# Patient Record
Sex: Female | Born: 1950
Health system: Southern US, Community
[De-identification: ages and names within clinical notes are randomized; demographics above are authoritative.]

## PROBLEM LIST (undated history)

## (undated) DIAGNOSIS — R9431 Abnormal electrocardiogram [ECG] [EKG]: Secondary | ICD-10-CM

## (undated) DIAGNOSIS — R7989 Other specified abnormal findings of blood chemistry: Secondary | ICD-10-CM

## (undated) DIAGNOSIS — R0789 Other chest pain: Secondary | ICD-10-CM

## (undated) DIAGNOSIS — R1110 Vomiting, unspecified: Secondary | ICD-10-CM

## (undated) DIAGNOSIS — M549 Dorsalgia, unspecified: Secondary | ICD-10-CM

## (undated) DIAGNOSIS — R51 Headache: Secondary | ICD-10-CM

## (undated) DIAGNOSIS — Z9889 Other specified postprocedural states: Secondary | ICD-10-CM

## (undated) DIAGNOSIS — R110 Nausea: Secondary | ICD-10-CM

## (undated) DIAGNOSIS — R519 Headache, unspecified: Secondary | ICD-10-CM

## (undated) DIAGNOSIS — C50919 Malignant neoplasm of unspecified site of unspecified female breast: Secondary | ICD-10-CM

## (undated) DIAGNOSIS — R778 Other specified abnormalities of plasma proteins: Secondary | ICD-10-CM

## (undated) HISTORY — DX: Nausea: R11.0

## (undated) HISTORY — DX: Other specified postprocedural states: Z98.890

## (undated) HISTORY — DX: Headache: R51

## (undated) HISTORY — DX: Other specified abnormalities of plasma proteins: R77.8

## (undated) HISTORY — DX: Headache, unspecified: R51.9

## (undated) HISTORY — DX: Vomiting, unspecified: R11.10

## (undated) HISTORY — DX: Other specified abnormal findings of blood chemistry: R79.89

## (undated) HISTORY — PX: BACK SURGERY: SHX140

---
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
## 2010-10-02 HISTORY — PX: CRANIOTOMY: SHX93

## 2012-06-08 ENCOUNTER — Emergency Department (HOSPITAL_COMMUNITY): Payer: Medicare Other

## 2012-06-08 ENCOUNTER — Inpatient Hospital Stay (HOSPITAL_COMMUNITY)
Admission: EM | Admit: 2012-06-08 | Discharge: 2012-06-10 | DRG: 313 | Disposition: A | Discharge status: Home / Self Care | Payer: Medicare Other | Attending: Cardiovascular Disease | Admitting: Cardiovascular Disease

## 2012-06-08 ENCOUNTER — Encounter (HOSPITAL_COMMUNITY): Payer: Self-pay | Admitting: Emergency Medicine

## 2012-06-08 DIAGNOSIS — Z79899 Other long term (current) drug therapy: Secondary | ICD-10-CM

## 2012-06-08 DIAGNOSIS — R748 Abnormal levels of other serum enzymes: Secondary | ICD-10-CM | POA: Diagnosis present

## 2012-06-08 DIAGNOSIS — R930 Abnormal findings on diagnostic imaging of skull and head, not elsewhere classified: Secondary | ICD-10-CM | POA: Diagnosis present

## 2012-06-08 DIAGNOSIS — D72829 Elevated white blood cell count, unspecified: Secondary | ICD-10-CM | POA: Diagnosis present

## 2012-06-08 DIAGNOSIS — R7989 Other specified abnormal findings of blood chemistry: Secondary | ICD-10-CM | POA: Diagnosis present

## 2012-06-08 DIAGNOSIS — R0989 Other specified symptoms and signs involving the circulatory and respiratory systems: Secondary | ICD-10-CM | POA: Diagnosis present

## 2012-06-08 DIAGNOSIS — R1013 Epigastric pain: Secondary | ICD-10-CM | POA: Diagnosis present

## 2012-06-08 DIAGNOSIS — R0789 Other chest pain: Principal | ICD-10-CM | POA: Diagnosis present

## 2012-06-08 DIAGNOSIS — I214 Non-ST elevation (NSTEMI) myocardial infarction: Secondary | ICD-10-CM

## 2012-06-08 DIAGNOSIS — R9431 Abnormal electrocardiogram [ECG] [EKG]: Secondary | ICD-10-CM | POA: Diagnosis present

## 2012-06-08 DIAGNOSIS — R112 Nausea with vomiting, unspecified: Secondary | ICD-10-CM | POA: Diagnosis present

## 2012-06-08 HISTORY — DX: Abnormal electrocardiogram (ECG) (EKG): R94.31

## 2012-06-08 HISTORY — DX: Other chest pain: R07.89

## 2012-06-08 LAB — CBC WITH DIFFERENTIAL/PLATELET
Basophils Relative: 0 % (ref 0–1)
Hemoglobin: 14.8 g/dL (ref 12.0–15.0)
Lymphs Abs: 1.8 10*3/uL (ref 0.7–4.0)
Monocytes Relative: 7 % (ref 3–12)
Neutro Abs: 15.7 10*3/uL — ABNORMAL HIGH (ref 1.7–7.7)
Neutrophils Relative %: 83 % — ABNORMAL HIGH (ref 43–77)
RBC: 5.14 MIL/uL — ABNORMAL HIGH (ref 3.87–5.11)

## 2012-06-08 LAB — BASIC METABOLIC PANEL
BUN: 16 mg/dL (ref 6–23)
Chloride: 104 mEq/L (ref 96–112)
GFR calc Af Amer: >90 mL/min (ref >90)
Glucose, Bld: 112 mg/dL — ABNORMAL HIGH (ref 70–99)
Potassium: 3.3 mEq/L — ABNORMAL LOW (ref 3.5–5.1)
Sodium: 141 mEq/L (ref 135–145)

## 2012-06-08 LAB — POCT I-STAT TROPONIN I
Troponin i, poc: 0.02 ng/mL (ref 0.00–0.08)
Troponin i, poc: 0.11 ng/mL (ref 0.00–0.08)

## 2012-06-08 LAB — HEPATIC FUNCTION PANEL
ALT: 26 U/L (ref 0–35)
Total Protein: 6.9 g/dL (ref 6.0–8.3)

## 2012-06-08 MED ORDER — HEPARIN BOLUS VIA INFUSION
4000.0000 [IU] | Freq: Once | INTRAVENOUS | Status: DC
  Administered 2012-06-08: 4000 [IU] via INTRAVENOUS

## 2012-06-08 MED ORDER — SODIUM CHLORIDE 0.9 % IV BOLUS (SEPSIS)
500.0000 mL | Freq: Once | INTRAVENOUS | Status: AC
  Administered 2012-06-08: 16:00:00 via INTRAVENOUS

## 2012-06-08 MED ORDER — HEPARIN (PORCINE) IN NACL 100-0.45 UNIT/ML-% IJ SOLN
900.0000 [IU]/h | INTRAMUSCULAR | Status: DC
  Administered 2012-06-08: 900 [IU]/h via INTRAVENOUS
  Filled 2012-06-08: qty 250

## 2012-06-08 MED ORDER — ATORVASTATIN CALCIUM 10 MG PO TABS
10.0000 mg | ORAL_TABLET | Freq: Every day | ORAL | Status: DC
  Administered 2012-06-09: 10 mg via ORAL
  Filled 2012-06-08 (×2): qty 1

## 2012-06-08 MED ORDER — NITROGLYCERIN 0.4 MG SL SUBL: 0.4000 mg | SUBLINGUAL_TABLET | SUBLINGUAL | Status: DC | PRN

## 2012-06-08 MED ORDER — NITROGLYCERIN 0.4 MG SL SUBL
0.4000 mg | SUBLINGUAL_TABLET | SUBLINGUAL | Status: DC | PRN
  Administered 2012-06-08: 0.4 mg via SUBLINGUAL
  Filled 2012-06-08: qty 25

## 2012-06-08 MED ORDER — ASPIRIN 81 MG PO CHEW
324.0000 mg | CHEWABLE_TABLET | ORAL | Status: AC
  Administered 2012-06-08: 324 mg via ORAL

## 2012-06-08 MED ORDER — ASPIRIN EC 81 MG PO TBEC
81.0000 mg | DELAYED_RELEASE_TABLET | Freq: Every day | ORAL | Status: DC
  Administered 2012-06-09: 81 mg via ORAL
  Filled 2012-06-08 (×2): qty 1

## 2012-06-08 MED ORDER — ONDANSETRON HCL 4 MG/2ML IJ SOLN: 4.0000 mg | Freq: Four times a day (QID) | INTRAMUSCULAR | Status: DC | PRN

## 2012-06-08 MED ORDER — ASPIRIN 81 MG PO CHEW
324.0000 mg | CHEWABLE_TABLET | Freq: Once | ORAL | Status: AC
  Administered 2012-06-08: 324 mg via ORAL
  Filled 2012-06-08: qty 4

## 2012-06-08 MED ORDER — ASPIRIN 300 MG RE SUPP: 300.0000 mg | RECTAL | Status: AC

## 2012-06-08 MED ORDER — ACETAMINOPHEN 325 MG PO TABS: 650.0000 mg | ORAL_TABLET | ORAL | Status: DC | PRN

## 2012-06-08 NOTE — Progress Notes (Signed)
ANTICOAGULATION CONSULT NOTE - Initial Consult  Pharmacy Consult for Heparin Indication: NSTEMI  No Known Allergies  Patient Measurements: Height: 5\' 2"  (157.5 cm) Weight: 162 lb (73.483 kg) IBW/kg (Calculated) : 50.1  Heparin Dosing Weight: 66 Kg  Vital Signs: Temp: 98.5 F (36.9 C) (01/06 1212) Temp src: Oral (01/06 1212) BP: 122/72 mmHg (01/06 2015) Pulse Rate: 63  (01/06 2015)  Labs:  Basename 06/08/12 1300  HGB 14.8  HCT 43.3  PLT 240  APTT --  LABPROT --  INR --  HEPARINUNFRC --  CREATININE 0.60  CKTOTAL --  CKMB --  TROPONINI --    Estimated Creatinine Clearance: 69.4 ml/min (by C-G formula based on Cr of 0.6).   Medical History: History reviewed. No pertinent past medical history.  Medications:  Mucinex PTA  Assessment: Shannon Fernandez is a 62 yo without significant PMH who presents with CP and rising troponin. No prior anticoagulants noted, baseline H/H and plts are normal, LFTs and Scr is normal.   Goal of Therapy:  Heparin level 0.3-0.7 units/ml Monitor platelets by anticoagulation protocol: Yes   Plan:  Give 4000 units bolus x 1 Start heparin infusion at 900 units/hr Check anti-Xa level in 6 hours and daily while on heparin Continue to monitor H&H and platelets   Thanks, Shannon Fernandez, PharmD, BCPS.  Clinical Pharmacist Pager [phone]. 06/08/2012 8:31 PM

## 2012-06-08 NOTE — H&P (Signed)
[facility] History and Physical  Derrian Rodak ZOX:[id number] DOB: 09-May-1951 DOA: 06/08/2012  Referring physician: ED PCP: Dennis Bast, MD  Specialists: None  Chief Complaint: Chest pain, emesis  HPI: Shannon Fernandez is a 62 y.o. female who presents with a pressure like substernal chest pain, onset earlier today at 11 am, was 6/10, after onset was associated with multiple non bloody non bilious emesis episodes this AM.  Patient denies recent DOE, or SOB.  CP not really associated with SOB she states.  In the ED patient was found to have a positive troponin of 0.11, given her symptoms and the positive troponin hospitalist has been asked to admit for [facility] per prior arrangement for patients NSTEMI.  Review of Systems: 12 systems reviewed and otherwise negative.  History reviewed. No pertinent past medical history. History reviewed. No pertinent past surgical history. Social History:  reports that she has never smoked. She does not have any smokeless tobacco history on file. She reports that she does not drink alcohol or use illicit drugs.  No Known Allergies  History reviewed. No pertinent family history. No history of early onset heart disease.  Prior to Admission medications   Medication Sig Start Date End Date Taking? Authorizing Provider  guaiFENesin (MUCINEX) 600 MG 12 hr tablet Take 600 mg by mouth at bedtime.   Yes Historical Provider, MD   Physical Exam: Filed Vitals:   06/08/12 1945 06/08/12 2012 06/08/12 2015 06/08/12 2045  BP: 116/54  122/72 119/57  Pulse: 58  63 63  Temp:      TempSrc:      Resp: 18  16 18   Height:  5\' 2"  (1.575 m)    Weight:  73.483 kg (162 lb)    SpO2: 98%  99% 98%    General:  NAD, resting comfortably in bed Eyes: PEERLA EOMI ENT: mucous membranes moist Neck: supple w/o JVD Cardiovascular: RRR w/o MRG Respiratory: CTA B Abdomen: soft, nt, nd, bs+ Skin: no rash nor lesion Musculoskeletal: MAE, full ROM all 4 extremities Psychiatric:  normal tone and affect Neurologic: AAOx3, grossly non-focal  Labs on Admission:  Basic Metabolic Panel:  Lab 06/08/12 8119  NA 141  K 3.3*  CL 104  CO2 23  GLUCOSE 112*  BUN 16  CREATININE 0.60  CALCIUM 9.8  MG --  PHOS --   Liver Function Tests:  Lab 06/08/12 1630  AST 23  ALT 26  ALKPHOS 61  BILITOT 0.4  PROT 6.9  ALBUMIN 3.6    Lab 06/08/12 1300  LIPASE 44  AMYLASE --   No results found for this basename: AMMONIA:5 in the last 168 hours CBC:  Lab 06/08/12 1300  WBC 18.9*  NEUTROABS 15.7*  HGB 14.8  HCT 43.3  MCV 84.2  PLT 240   Cardiac Enzymes: No results found for this basename: CKTOTAL:5,CKMB:5,CKMBINDEX:5,TROPONINI:5 in the last 168 hours  BNP (last 3 results) No results found for this basename: PROBNP:3 in the last 8760 hours CBG: No results found for this basename: GLUCAP:5 in the last 168 hours  Radiological Exams on Admission: Dg Chest 2 View  06/08/2012  *RADIOLOGY REPORT*  Clinical Data: Chest pain.  CHEST - 2 VIEW  Comparison: None  Findings: Heart and mediastinal contours are within normal limits. No focal opacities or effusions.  No acute bony abnormality.  IMPRESSION: No active cardiopulmonary disease.   Original Report Authenticated By: Charlett Nose, M.D.    Ct Head Wo Contrast  06/08/2012  *RADIOLOGY REPORT*  Clinical Data: Sudden onset vomiting.  Buzzing sounds in the ears. Headache.  CT HEAD WITHOUT CONTRAST  Technique:  Contiguous axial images were obtained from the base of the skull through the vertex without contrast.  Comparison: None.  Findings: Left occipital craniotomy.  There is a small calcified mass at the left cerebellopontine angle measuring 16 mm AP by 11 mm transverse.  This may represent a calcified meningioma. Potentially this could be postsurgical as well.  Correlate with surgical history.  Fluid is present within the left posterior mastoid air cells.  Paranasal sinuses appear clear.  There is no midline shift, hydrocephalus,  or evidence of acute infarction. Benign basal ganglia calcifications are present.  IMPRESSION: 1.  No acute intracranial abnormality. 2.  Left occipital craniotomy. 3.  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  This may represent postsurgical changes or a small calcified meningioma.  Correlate with previous neurosurgical history.   Original Report Authenticated By: Andreas Newport, M.D.    US Abdomen Complete  06/08/2012  *RADIOLOGY REPORT*  Clinical Data:  Chest pain.  Abdominal pain.  COMPLETE ABDOMINAL ULTRASOUND  Comparison:  None.  Findings:  Gallbladder:  No gallstones, gallbladder wall thickening, or pericholecystic fluid.  Common bile duct:  4 mm, normal.  Liver:  No focal mass lesion.  Diffusely echogenic suggesting hepatic steatosis.  IVC:  Appears normal.  Pancreas:  No focal abnormality seen.  Spleen:  8.6 cm.  Normal echotexture.  Right Kidney:  13.4 cm. Normal echotexture.  Normal central sinus echo complex.  No calculi or hydronephrosis.  Left Kidney:  12.6 cm. Normal echotexture.  Normal central sinus echo complex.  No calculi or hydronephrosis.  Abdominal aorta:  No aneurysm identified.  IMPRESSION: Negative for cholelithiasis or cholecystitis.  Echogenic liver most compatible with hepatic steatosis.   Original Report Authenticated By: Andreas Newport, M.D.     EKG: Independently reviewed.  Assessment/Plan Principal Problem:  *NSTEMI (non-ST elevated myocardial infarction)   1. NSTEMI - given the presence of elevated troponin (however mildly elevated) in the context of the patients symptoms (substernal chest pain, sudden onset in AM, associated with N/V), and absence of other obvious causes, will treat initially as NSTEMI / ACS.  Discussed with [facility] and they advised heparin gtt, at this time.  Have ordered this.  Patient is hemodynamically stable otherwise, her CP has resolved at this time so will hold off  on starting a nitro gtt for now but would start this if chest pain returned.  Admitting patient to stepdown, serial troponins ordered to see if these do indeed trend up further.  Will keep patient NPO after midnight in case cardiology wants to take her to the cath lab tomorrow.  [facility] has been consulted for NSTEMI, likely will assume care in AM per prior arrangement.  Code Status: Full Code (must indicate code status--if unknown or must be presumed, indicate so) Family Communication: Spoke with daughter at bedside (indicate person spoken with, if applicable, with phone number if by telephone) Disposition Plan: Admit to inpatient (indicate anticipated LOS)  Time spent: 70 min  GARDNER, JARED M. [facility] Pager [phone]  If 7PM-7AM, please contact night-coverage [email or web address] Password [facility] 06/08/2012, 10:57 PM

## 2012-06-08 NOTE — ED Notes (Signed)
Pt denies any aspirin intake in past 12hrs

## 2012-06-08 NOTE — ED Notes (Signed)
Pt c/o midsternal CP with vomiting this am; pt sts buzzing sound in head with blurry vision last night that is now resolved; pt sts hx of cerebral artery blockage

## 2012-06-08 NOTE — ED Provider Notes (Signed)
History     CSN: [id number]  Arrival date & time 06/08/12  1156   First MD Initiated Contact with Patient 06/08/12 1507      Chief Complaint  Patient presents with  . Chest Pain  . Emesis  . Headache    (Consider location/radiation/quality/duration/timing/severity/associated sxs/prior treatment) HPI  Shannon Fernandez is a 62 y.o. female complaining of pressure like substernal chest pain starting at 11 AM. Pain is rated a 6/10 this started after that the patient had multiple episodes of nonbloody, non-coffee ground slightly green emesis this a.m. vomiting started after patient had a spinach and a half take this a.m. Patient had a sensation of hearing pulsations in her right ear starting at 9 PM last night. This is the second time this has happened. The first time was after compression or several years ago for which she had had surgery to correct. Patient denies shortness of breath, headache, dysarthria, difficulty understanding words, difficulty walking, change in bowel or bladder habits. One prior abdominal surgery: Appendectomy.  PCP yuri cabesa.   History reviewed. No pertinent past medical history.  History reviewed. No pertinent past surgical history.  History reviewed. No pertinent family history.  History  Substance Use Topics  . Smoking status: Never Smoker   . Smokeless tobacco: Not on file  . Alcohol Use: No    OB History    Grav Para Term Preterm Abortions TAB SAB Ect Mult Living                  Review of Systems  Constitutional: Negative for fever.  Respiratory: Negative for shortness of breath.   Cardiovascular: Negative for chest pain.  Gastrointestinal: Positive for nausea and vomiting. Negative for abdominal pain and diarrhea.  All other systems reviewed and are negative.    Allergies  Review of patient's allergies indicates no known allergies.  Home Medications  No current outpatient prescriptions on file.  BP 132/73  Pulse 114  Temp 98.5 F  (36.9 C) (Oral)  Resp 20  SpO2 93%  Physical Exam  Nursing note and vitals reviewed. Constitutional: She is oriented to person, place, and time. She appears well-developed and well-nourished. No distress.  HENT:  Head: Normocephalic and atraumatic.  Right Ear: External ear normal.  Left Ear: External ear normal.  Mouth/Throat: Oropharynx is clear and moist.  Eyes: Conjunctivae normal and EOM are normal. Pupils are equal, round, and reactive to light.  Neck: Normal range of motion. No JVD present.  Cardiovascular: Normal rate and intact distal pulses.   Pulmonary/Chest: Effort normal and breath sounds normal. No stridor. No respiratory distress. She has no wheezes. She has no rales. She exhibits tenderness.       Patient is tender but not reproducibly so to the lower sternum  Abdominal: Soft. Bowel sounds are normal. She exhibits no distension and no mass. There is tenderness. There is no rebound and no guarding.       Tenderness to palpation of the epigastrium and right lower quadrant with no guarding or rebound.  Musculoskeletal: Normal range of motion.  Neurological: She is alert and oriented to person, place, and time.  Psychiatric: She has a normal mood and affect.    ED Course  Procedures (including critical care time)  Labs Reviewed  CBC WITH DIFFERENTIAL - Abnormal; Notable for the following:    WBC 18.9 (*)     RBC 5.14 (*)     Neutrophils Relative 83 (*)     Neutro Abs 15.7 (*)  Lymphocytes Relative 10 (*)     Monocytes Absolute 1.2 (*)     All other components within normal limits  BASIC METABOLIC PANEL - Abnormal; Notable for the following:    Potassium 3.3 (*)     Glucose, Bld 112 (*)     All other components within normal limits  LIPASE, BLOOD  POCT I-STAT TROPONIN I   Dg Chest 2 View  06/08/2012  *RADIOLOGY REPORT*  Clinical Data: Chest pain.  CHEST - 2 VIEW  Comparison: None  Findings: Heart and mediastinal contours are within normal limits. No focal  opacities or effusions.  No acute bony abnormality.  IMPRESSION: No active cardiopulmonary disease.   Original Report Authenticated By: Charlett Nose, M.D.     Date: 06/08/2012  Rate: 115  Rhythm: sinus tachycardia  QRS Axis: right  Intervals: normal  ST/T Wave abnormalities: nonspecific ST changes and nonspecific ST/T changes  Conduction Disutrbances:none  Narrative Interpretation:   Old EKG Reviewed: none available   Date: 06/08/2012  Rate: 59  Rhythm: normal sinus rhythm  QRS Axis: left  Intervals: normal  ST/T Wave abnormalities: nonspecific T wave changes  Conduction Disutrbances:none  Narrative Interpretation: T Flipped in Avl  Old EKG Reviewed: unchanged    3:51 PM patient reports a slight resolution chest pressure after one nitroglycerin pain reduced from 6/10 at 5/10.  1. NSTEMI (non-ST elevated myocardial infarction)       MDM  Delta troponin is positive at 0.11  Pt  Will be admitted to to [facility] Dr. Julian Reil.         Wynetta Emery, PA-C 06/09/12 0020

## 2012-06-09 ENCOUNTER — Encounter (HOSPITAL_COMMUNITY): Payer: Self-pay | Admitting: Cardiology

## 2012-06-09 DIAGNOSIS — R0989 Other specified symptoms and signs involving the circulatory and respiratory systems: Secondary | ICD-10-CM | POA: Diagnosis present

## 2012-06-09 DIAGNOSIS — D72829 Elevated white blood cell count, unspecified: Secondary | ICD-10-CM | POA: Diagnosis present

## 2012-06-09 DIAGNOSIS — R1013 Epigastric pain: Secondary | ICD-10-CM | POA: Diagnosis present

## 2012-06-09 LAB — CBC
HCT: 36.4 % (ref 36.0–46.0)
MCH: 28.2 pg (ref 26.0–34.0)
MCHC: 33.5 g/dL (ref 30.0–36.0)
MCV: 84.3 fL (ref 78.0–100.0)
Platelets: 198 10*3/uL (ref 150–400)
RDW: 13.5 % (ref 11.5–15.5)

## 2012-06-09 LAB — LIPID PANEL
LDL Cholesterol: 112 mg/dL — ABNORMAL HIGH (ref 0–99)
Total CHOL/HDL Ratio: 5.3 RATIO
Triglycerides: 176 mg/dL — ABNORMAL HIGH (ref <150)
VLDL: 35 mg/dL (ref 0–40)

## 2012-06-09 LAB — BASIC METABOLIC PANEL
BUN: 12 mg/dL (ref 6–23)
CO2: 27 mEq/L (ref 19–32)
Calcium: 8.8 mg/dL (ref 8.4–10.5)
GFR calc non Af Amer: >90 mL/min (ref >90)
Glucose, Bld: 89 mg/dL (ref 70–99)

## 2012-06-09 LAB — TROPONIN I
Troponin I: <0.3 ng/mL (ref <0.30)
Troponin I: <0.3 ng/mL (ref <0.30)

## 2012-06-09 MED ORDER — HEPARIN (PORCINE) IN NACL 100-0.45 UNIT/ML-% IJ SOLN
1000.0000 [IU]/h | INTRAMUSCULAR | Status: DC
  Filled 2012-06-09 (×2): qty 250

## 2012-06-09 MED ORDER — PANTOPRAZOLE SODIUM 40 MG PO TBEC
40.0000 mg | DELAYED_RELEASE_TABLET | Freq: Every day | ORAL | Status: DC
  Administered 2012-06-09: 40 mg via ORAL
  Filled 2012-06-09: qty 1

## 2012-06-09 NOTE — Consult Note (Signed)
Reason for Consult: Elevated POC marker  Requesting Physician: [facility]  HPI: This is a 62 y.o. female originally from [place], who has lived in [place] for the last 21 yrs. She and her husband  recently moved to [place]. She has a history of a craniotomy in [place] May 2012 but no other significant medical history or surgeries. Yesterday afternoon she had some vague headache and "heard a noise" in her head. She drank a protein shake and the had nausea and vomiting. This was followed by epigastric pain. In the ER her POC Troponin was positive- 0.11, (NL 0.0-0.08). EKG shows no acute changes. She was placed on Heparin and admitted. Second Troponin is WNL. Her epigastric pain resolved spontaneously. In the ER her initial WBC was 19K with a left shift- this am its 6K (? Lab error)  PMHx:  History reviewed. No pertinent past medical history. Past Surgical History  Procedure Date  . Craniotomy 5/12    in [facility]    FAMHx: History reviewed. No pertinent family history.  SOCHx:  reports that she has never smoked. She does not have any smokeless tobacco history on file. She reports that she does not drink alcohol or use illicit drugs.  ALLERGIES: No Known Allergies  ROS: Pertinent items are noted in HPI. Her son in law says she has been under a great deal of stress secondary to some family issues. 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 No fever or chills, no diarhea. She had a flu shot last week, she has had a cough, no orthopnea or DOE  HOME MEDICATIONS: Prescriptions prior to admission  Medication Sig Dispense Refill  . guaiFENesin (MUCINEX) 600 MG 12 hr tablet Take 600 mg by mouth at bedtime.        HOSPITAL MEDICATIONS: I have reviewed the patient's current medications.  VITALS: Blood pressure 127/54, pulse 63, temperature 97.8 F (36.6 C), temperature source Oral, resp. rate 18, height 5\' 2"  (1.575 m), weight 73.483 kg (162 lb), SpO2 99.00%.  PHYSICAL  EXAM: General appearance: alert, cooperative and no distress Neck: no carotid bruit and no JVD Lungs: clear to auscultation bilaterally Heart: regular rate and rhythm, S1, S2 normal, no murmur, click, rub or gallop Abdomen: soft, non-tender; bowel sounds normal; no masses,  no organomegaly Extremities: extremities normal, atraumatic, no cyanosis or edema Pulses: 2+ and symmetric Skin: Skin color, texture, turgor normal. No rashes or lesions Neurologic: Grossly normal  LABS: Results for orders placed during the hospital encounter of 06/08/12 (from the past 48 hour(s))  CBC WITH DIFFERENTIAL     Status: Abnormal   Collection Time   06/08/12  1:00 PM      Component Value Range Comment   WBC 18.9 (*) 4.0 - 10.5 K/uL    RBC 5.14 (*) 3.87 - 5.11 MIL/uL    Hemoglobin 14.8  12.0 - 15.0 g/dL    HCT 16.1  09.6 - 04.5 %    MCV 84.2  78.0 - 100.0 fL    MCH 28.8  26.0 - 34.0 pg    MCHC 34.2  30.0 - 36.0 g/dL    RDW 40.9  81.1 - 91.4 %    Platelets 240  150 - 400 K/uL    Neutrophils Relative 83 (*) 43 - 77 %    Neutro Abs 15.7 (*) 1.7 - 7.7 K/uL    Lymphocytes Relative 10 (*) 12 - 46 %    Lymphs Abs 1.8  0.7 - 4.0 K/uL  Monocytes Relative 7  3 - 12 %    Monocytes Absolute 1.2 (*) 0.1 - 1.0 K/uL    Eosinophils Relative 1  0 - 5 %    Eosinophils Absolute 0.2  0.0 - 0.7 K/uL    Basophils Relative 0  0 - 1 %    Basophils Absolute 0.0  0.0 - 0.1 K/uL   BASIC METABOLIC PANEL     Status: Abnormal   Collection Time   06/08/12  1:00 PM      Component Value Range Comment   Sodium 141  135 - 145 mEq/L    Potassium 3.3 (*) 3.5 - 5.1 mEq/L    Chloride 104  96 - 112 mEq/L    CO2 23  19 - 32 mEq/L    Glucose, Bld 112 (*) 70 - 99 mg/dL    BUN 16  6 - 23 mg/dL    Creatinine, Ser 1.61  0.50 - 1.10 mg/dL    Calcium 9.8  8.4 - 09.6 mg/dL    GFR calc non Af Amer >90  >90 mL/min    GFR calc Af Amer >90  >90 mL/min   LIPASE, BLOOD     Status: Normal   Collection Time   06/08/12  1:00 PM      Component  Value Range Comment   Lipase 44  11 - 59 U/L   POCT I-STAT TROPONIN I     Status: Normal   Collection Time   06/08/12  1:09 PM      Component Value Range Comment   Troponin i, poc 0.02  0.00 - 0.08 ng/mL    Comment 3            HEPATIC FUNCTION PANEL     Status: Normal   Collection Time   06/08/12  4:30 PM      Component Value Range Comment   Total Protein 6.9  6.0 - 8.3 g/dL    Albumin 3.6  3.5 - 5.2 g/dL    AST 23  0 - 37 U/L    ALT 26  0 - 35 U/L    Alkaline Phosphatase 61  39 - 117 U/L    Total Bilirubin 0.4  0.3 - 1.2 mg/dL    Bilirubin, Direct <0.4  0.0 - 0.3 mg/dL    Indirect Bilirubin NOT CALCULATED  0.3 - 0.9 mg/dL   POCT I-STAT TROPONIN I     Status: Abnormal   Collection Time   06/08/12  7:37 PM      Component Value Range Comment   Troponin i, poc 0.11 (*) 0.00 - 0.08 ng/mL    Comment NOTIFIED PHYSICIAN      Comment 3            TROPONIN I     Status: Normal   Collection Time   06/08/12 11:19 PM      Component Value Range Comment   Troponin I <0.30  <0.30 ng/mL   MRSA PCR SCREENING     Status: Normal   Collection Time   06/09/12 12:57 AM      Component Value Range Comment   MRSA by PCR NEGATIVE  NEGATIVE   HEPARIN LEVEL (UNFRACTIONATED)     Status: Normal   Collection Time   06/09/12  3:00 AM      Component Value Range Comment   Heparin Unfractionated 0.30  0.30 - 0.70 IU/mL   CBC     Status: Normal   Collection Time   06/09/12  4:00 AM      Component Value Range Comment   WBC 6.0  4.0 - 10.5 K/uL    RBC 4.32  3.87 - 5.11 MIL/uL    Hemoglobin 12.2  12.0 - 15.0 g/dL DELTA CHECK NOTED   HCT 36.4  36.0 - 46.0 %    MCV 84.3  78.0 - 100.0 fL    MCH 28.2  26.0 - 34.0 pg    MCHC 33.5  30.0 - 36.0 g/dL    RDW 16.1  09.6 - 04.5 %    Platelets 198  150 - 400 K/uL   TROPONIN I     Status: Normal   Collection Time   06/09/12  4:00 AM      Component Value Range Comment   Troponin I <0.30  <0.30 ng/mL   BASIC METABOLIC PANEL     Status: Normal   Collection Time   06/09/12   4:00 AM      Component Value Range Comment   Sodium 140  135 - 145 mEq/L    Potassium 3.5  3.5 - 5.1 mEq/L    Chloride 105  96 - 112 mEq/L    CO2 27  19 - 32 mEq/L    Glucose, Bld 89  70 - 99 mg/dL    BUN 12  6 - 23 mg/dL    Creatinine, Ser 4.09  0.50 - 1.10 mg/dL    Calcium 8.8  8.4 - 81.1 mg/dL    GFR calc non Af Amer >90  >90 mL/min    GFR calc Af Amer >90  >90 mL/min   LIPID PANEL     Status: Abnormal   Collection Time   06/09/12  4:00 AM      Component Value Range Comment   Cholesterol 181  0 - 200 mg/dL    Triglycerides 914 (*) <150 mg/dL    HDL 34 (*) >78 mg/dL    Total CHOL/HDL Ratio 5.3      VLDL 35  0 - 40 mg/dL    LDL Cholesterol 295 (*) 0 - 99 mg/dL     IMAGING: Dg Chest 2 View  06/08/2012  *RADIOLOGY REPORT*  Clinical Data: Chest pain.  CHEST - 2 VIEW  Comparison: None  Findings: Heart and mediastinal contours are within normal limits. No focal opacities or effusions.  No acute bony abnormality.  IMPRESSION: No active cardiopulmonary disease.   Original Report Authenticated By: Charlett Nose, M.D.    Ct Head Wo Contrast  06/08/2012  *RADIOLOGY REPORT*  Clinical Data: Sudden onset vomiting.  Buzzing sounds in the ears. Headache.  CT HEAD WITHOUT CONTRAST  Technique:  Contiguous axial images were obtained from the base of the skull through the vertex without contrast.  Comparison: None.  Findings: Left occipital craniotomy.  There is a small calcified mass at the left cerebellopontine angle measuring 16 mm AP by 11 mm transverse.  This may represent a calcified meningioma. Potentially this could be postsurgical as well.  Correlate with surgical history.  Fluid is present within the left posterior mastoid air cells.  Paranasal sinuses appear clear.  There is no midline shift, hydrocephalus, or evidence of acute infarction. Benign basal ganglia calcifications are present.  IMPRESSION: 1.  No acute intracranial abnormality. 2.  Left occipital craniotomy. 3.  Calcified left  cerebellopontine angle mass measuring 60 mm x 11 mm without mass effect or surrounding edema.  This may represent postsurgical changes or a small calcified meningioma.  Correlate with previous neurosurgical history.  Original Report Authenticated By: Andreas Newport, M.D.    US Abdomen Complete  06/08/2012  *RADIOLOGY REPORT*  Clinical Data:  Chest pain.  Abdominal pain.  COMPLETE ABDOMINAL ULTRASOUND  Comparison:  None.  Findings:  Gallbladder:  No gallstones, gallbladder wall thickening, or pericholecystic fluid.  Common bile duct:  4 mm, normal.  Liver:  No focal mass lesion.  Diffusely echogenic suggesting hepatic steatosis.  IVC:  Appears normal.  Pancreas:  No focal abnormality seen.  Spleen:  8.6 cm.  Normal echotexture.  Right Kidney:  13.4 cm. Normal echotexture.  Normal central sinus echo complex.  No calculi or hydronephrosis.  Left Kidney:  12.6 cm. Normal echotexture.  Normal central sinus echo complex.  No calculi or hydronephrosis.  Abdominal aorta:  No aneurysm identified.  IMPRESSION: Negative for cholelithiasis or cholecystitis.  Echogenic liver most compatible with hepatic steatosis.   Original Report Authenticated By: Andreas Newport, M.D.     IMPRESSION:  Principal Problem:  *Elevated troponin, POC +, Troponin I negative  Active Problems:  Epigastric pain after vomiting  Bruit, RFA  Leukocytosis on admission (? lab error)   RECOMMENDATION: Add PPI, continue to cycle Troponin. Check follow up EKG this am. Continue Heparin till next Troponin and then stop if that is negative. Advance diet and mobilize- ? Possible discharge later with plans for an OP Myoview.  Time Spent Directly with Patient: 45 minutes    1:40pm- Troponin continues to be negative but significant new EKG changes with TWI in leads 1, AVL, and V6. Will advance diet, check echo, continue Heparin. MD to see she may need diagnostic cath in am. She is not on a beta blocker because of baseline  bradycardia.  Corine Shelter K 06/09/2012, 9:27 AM   I have seen and evaluated the patient this evening after Corine Shelter, [place]. I agree with his findings, examination as well as impression.  62 y/o woman with no notable Cardiac RFs - p/w epigastric pain after multiple episodes of Nausea - emesis.   She has not note any CP/SOB with exertion.  No PND/orthopnea or edema.  No other Sx to suggest a likely cardiac etiology.  Her ECG is indeed abnormal with Lateral TWI, but has a normal Echo.  At this point , I am more inclined to pursue a TM Myoview in AM.   Will d/c IV Heparin.  Can transfer to tele.  This plan was communicated to her daughter who will help with the translation.  Marykay Lex, M.D., M.S. [facility] [address] [place], [place]  [address]  [phone] Pager # [phone] 06/09/2012 6:30 PM .

## 2012-06-09 NOTE — Progress Notes (Signed)
ANTICOAGULATION CONSULT NOTE - Follow Up Consult  Pharmacy Consult for Heparin Indication: NSTEMI  No Known Allergies  Patient Measurements: Height: 5\' 2"  (157.5 cm) Weight: 162 lb (73.483 kg) IBW/kg (Calculated) : 50.1  Heparin Dosing Weight: 66 Kg  Vital Signs: Temp: 97.6 F (36.4 C) (01/07 0100) Temp src: Oral (01/07 0100) BP: 113/56 mmHg (01/07 0300) Pulse Rate: 63  (01/07 0300)  Labs:  Basename 06/09/12 0300 06/08/12 2319 06/08/12 1300  HGB -- -- 14.8  HCT -- -- 43.3  PLT -- -- 240  APTT -- -- --  LABPROT -- -- --  INR -- -- --  HEPARINUNFRC 0.30 -- --  CREATININE -- -- 0.60  CKTOTAL -- -- --  CKMB -- -- --  TROPONINI -- <0.30 --    Estimated Creatinine Clearance: 69.4 ml/min (by C-G formula based on Cr of 0.6).   Medical History: History reviewed. No pertinent past medical history.  Medications:  Mucinex PTA  Assessment: Shannon Fernandez is a 62 yo without significant PMH who presents with CP and rising troponin. No prior anticoagulants noted, baseline H/H and plts are normal, LFTs and Scr is normal.   Heparin level (0.3) is at lower-end of goal range on 900 units/hr. No problem with line /infusion and no bleeding per RN.   Goal of Therapy:  Heparin level 0.3-0.7 units/ml Monitor platelets by anticoagulation protocol: Yes   Plan:  1. Increase IV heparin to 1000 units/hr to keep at-goal. 2. Heparin level in 6 hours.   Lorre Munroe, PharmD, BCPS 06/09/2012 4:07 AM

## 2012-06-09 NOTE — Progress Notes (Signed)
ANTICOAGULATION CONSULT NOTE - Follow Up Consult  Pharmacy Consult for Heparin Indication: usap  No Known Allergies  Patient Measurements: Height: 5\' 2"  (157.5 cm) Weight: 162 lb (73.483 kg) IBW/kg (Calculated) : 50.1  Heparin Dosing Weight: 66 Kg  Vital Signs: Temp: 97.9 F (36.6 C) (01/07 1140) Temp src: Oral (01/07 1140) BP: 117/54 mmHg (01/07 1100) Pulse Rate: 64  (01/07 1100)  Labs:  Basename 06/09/12 1150 06/09/12 0400 06/09/12 0300 06/08/12 2319 06/08/12 1300  HGB -- 12.2 -- -- 14.8  HCT -- 36.4 -- -- 43.3  PLT -- 198 -- -- 240  APTT -- -- -- -- --  LABPROT -- -- -- -- --  INR -- -- -- -- --  HEPARINUNFRC 0.35 -- 0.30 -- --  CREATININE -- 0.57 -- -- 0.60  CKTOTAL -- -- -- -- --  CKMB -- -- -- -- --  TROPONINI -- <0.30 -- <0.30 --    Estimated Creatinine Clearance: 69.4 ml/min (by C-G formula based on Cr of 0.57).   Medical History: History reviewed. No pertinent past medical history.  Medications:  Mucinex PTA  Assessment: Shannon Fernandez is a 62 y/o female without significant PMH who presents with CP. No prior anticoagulants noted, baseline H/H and plts are normal, LFTs and Scr is normal.   Cardiac enzymes negative. Possibly home today  Goal of Therapy:  Heparin level 0.3-0.7 units/ml Monitor platelets by anticoagulation protocol: Yes   Plan:  Continue heparin at 1000 units/hr and f/u in am  Verlene Mayer, PharmD, [place] Pager [phone] 06/09/2012 1:01 PM

## 2012-06-09 NOTE — Progress Notes (Signed)
*  PRELIMINARY RESULTS* Echocardiogram 2D Echocardiogram has been performed.  Shannon Fernandez 06/09/2012, 2:21 PM

## 2012-06-10 ENCOUNTER — Encounter (HOSPITAL_COMMUNITY): Payer: Medicare Other

## 2012-06-10 ENCOUNTER — Inpatient Hospital Stay (HOSPITAL_COMMUNITY): Payer: Medicare Other

## 2012-06-10 ENCOUNTER — Encounter (HOSPITAL_COMMUNITY): Payer: Self-pay | Admitting: Cardiology

## 2012-06-10 ENCOUNTER — Other Ambulatory Visit (HOSPITAL_COMMUNITY): Payer: Self-pay | Admitting: Cardiology

## 2012-06-10 DIAGNOSIS — R0789 Other chest pain: Secondary | ICD-10-CM

## 2012-06-10 DIAGNOSIS — R9431 Abnormal electrocardiogram [ECG] [EKG]: Secondary | ICD-10-CM

## 2012-06-10 HISTORY — DX: Abnormal electrocardiogram (ECG) (EKG): R94.31

## 2012-06-10 HISTORY — DX: Other chest pain: R07.89

## 2012-06-10 LAB — CBC
HCT: 38.2 % (ref 36.0–46.0)
MCH: 27.7 pg (ref 26.0–34.0)
MCHC: 32.7 g/dL (ref 30.0–36.0)
RDW: 13.6 % (ref 11.5–15.5)

## 2012-06-10 MED ORDER — TECHNETIUM TC 99M SESTAMIBI GENERIC - CARDIOLITE
30.0000 | Freq: Once | INTRAVENOUS | Status: AC | PRN
  Administered 2012-06-10: 30 via INTRAVENOUS

## 2012-06-10 MED ORDER — PANTOPRAZOLE SODIUM 40 MG PO TBEC: 40.0000 mg | DELAYED_RELEASE_TABLET | Freq: Every day | ORAL | Status: DC

## 2012-06-10 MED ORDER — TECHNETIUM TC 99M SESTAMIBI GENERIC - CARDIOLITE
10.0000 | Freq: Once | INTRAVENOUS | Status: AC | PRN
  Administered 2012-06-10: 10 via INTRAVENOUS

## 2012-06-10 NOTE — ED Provider Notes (Signed)
Medical screening examination/treatment/procedure(s) were conducted as a shared visit with non-physician practitioner(s) and myself.  I personally evaluated the patient during the encounter.  62yo F, c/o mid-sternal chest pain since approx 11am PTA. Associated with N/V.  Denies SOB/palpitations. Also states she is having "pulsations" in her right ear since last night; worried about previous cranial surgery.  1st EKG with NS STTW changes, troponin negative. 2nd EKG with new TWI aVL, troponin positive.  Start ASA, heparin, admit.      Laray Anger, DO 06/10/12 1118

## 2012-06-10 NOTE — Progress Notes (Signed)
Agree with the NP/PA-C note as written.  Stress test images reviewed personally. No evidence for ischemia. EF 74% without wall motion abnormality. Ok for discharge later today. Outpatient work-up for epigastric pain.  Chrystie Nose, MD, [facility] Attending Cardiologist [facility]

## 2012-06-10 NOTE — Progress Notes (Signed)
Subjective: In nuc medicine, no further nausea or chest/abd pain  Objective: Vital signs in last 24 hours: Temp:  [97.2 F (36.2 C)-98.6 F (37 C)] 97.8 F (36.6 C) (01/08 0754) Pulse Rate:  [57-73] 58  (01/08 1000) Resp:  [11-21] 16  (01/08 1000) BP: (93-115)/(34-67) 102/49 mmHg (01/08 1000) SpO2:  [95 %-98 %] 98 % (01/08 1000) Weight change:  Last BM Date: 06/08/12 Intake/Output from previous day: 01/07 0701 - 01/08 0700 In: 830 [P.O.:720; I.V.:110] Out: 250 [Urine:250] Intake/Output this shift: Total I/O In: -  Out: 350 [Urine:350]  PE: General:alert and oriented, translator here for visit and test. Heart: S1S2 RRR no murmur or gallup Lungs: clear without rales rhonchi or wheezes ABD: soft, non tender + BS, ? Rt. Renal artery bruit- (exam at completion of exercise stress) Ext. No edema    Lab Results:  Basename 06/10/12 0522 06/09/12 0400  WBC 5.8 6.0  HGB 12.5 12.2  HCT 38.2 36.4  PLT 196 198   BMET  Basename 06/09/12 0400 06/08/12 1300  NA 140 141  K 3.5 3.3*  CL 105 104  CO2 27 23  GLUCOSE 89 112*  BUN 12 16  CREATININE 0.57 0.60  CALCIUM 8.8 9.8    Basename 06/09/12 1734 06/09/12 1203  TROPONINI <0.30 <0.30    Lab Results  Component Value Date   CHOL 181 06/09/2012   HDL 34* 06/09/2012   LDLCALC 112* 06/09/2012   TRIG 176* 06/09/2012   CHOLHDL 5.3 06/09/2012   Lab Results  Component Value Date   HGBA1C 6.2* 06/08/2012     Lab Results  Component Value Date   TSH 3.975 06/08/2012    Hepatic Function Panel  Basename 06/08/12 1630  PROT 6.9  ALBUMIN 3.6  AST 23  ALT 26  ALKPHOS 61  BILITOT 0.4  BILIDIR <0.1  IBILI NOT CALCULATED    Basename 06/09/12 0400  CHOL 181  Studies/Results: Dg Chest 2 View  06/08/2012  *RADIOLOGY REPORT*  Clinical Data: Chest pain.  CHEST - 2 VIEW  Comparison: None  Findings: Heart and mediastinal contours are within normal limits. No focal opacities or effusions.  No acute bony abnormality.  IMPRESSION: No active  cardiopulmonary disease.   Original Report Authenticated By: Charlett Nose, M.D.    Ct Head Wo Contrast  06/08/2012  *RADIOLOGY REPORT*  Clinical Data: Sudden onset vomiting.  Buzzing sounds in the ears. Headache.  CT HEAD WITHOUT CONTRAST  Technique:  Contiguous axial images were obtained from the base of the skull through the vertex without contrast.  Comparison: None.  Findings: Left occipital craniotomy.  There is a small calcified mass at the left cerebellopontine angle measuring 16 mm AP by 11 mm transverse.  This may represent a calcified meningioma. Potentially this could be postsurgical as well.  Correlate with surgical history.  Fluid is present within the left posterior mastoid air cells.  Paranasal sinuses appear clear.  There is no midline shift, hydrocephalus, or evidence of acute infarction. Benign basal ganglia calcifications are present.  IMPRESSION: 1.  No acute intracranial abnormality. 2.  Left occipital craniotomy. 3.  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  This may represent postsurgical changes or a small calcified meningioma.  Correlate with previous neurosurgical history.   Original Report Authenticated By: Andreas Newport, M.D.    US Abdomen Complete  06/08/2012  *RADIOLOGY REPORT*  Clinical Data:  Chest pain.  Abdominal pain.  COMPLETE ABDOMINAL ULTRASOUND  Comparison:  None.  Findings:  Gallbladder:  No gallstones, gallbladder wall thickening, or pericholecystic fluid.  Common bile duct:  4 mm, normal.  Liver:  No focal mass lesion.  Diffusely echogenic suggesting hepatic steatosis.  IVC:  Appears normal.  Pancreas:  No focal abnormality seen.  Spleen:  8.6 cm.  Normal echotexture.  Right Kidney:  13.4 cm. Normal echotexture.  Normal central sinus echo complex.  No calculi or hydronephrosis.  Left Kidney:  12.6 cm. Normal echotexture.  Normal central sinus echo complex.  No calculi or hydronephrosis.  Abdominal aorta:   No aneurysm identified.  IMPRESSION: Negative for cholelithiasis or cholecystitis.  Echogenic liver most compatible with hepatic steatosis.   Original Report Authenticated By: Andreas Newport, M.D.     Medications: I have reviewed the patient's current medications. Scheduled Meds:    . aspirin EC  81 mg Oral Daily  . atorvastatin  10 mg Oral q1800  . pantoprazole  40 mg Oral Q0600   Continuous Infusions:  PRN Meds:.acetaminophen, nitroGLYCERIN, ondansetron (ZOFRAN) IV  Assessment/Plan: Principal Problem:  *Elevated troponin, POC +, Troponin I negative Active Problems:  Abnormal EKG on admit  Chest pressure, on admit atypical  Epigastric pain after vomiting  Bruit, RFA  Leukocytosis on admission (? lab error)  PLAN: for nuc study today will examine at that time.  EKG with continued abnormalities.   No further Abd. Pain or nausea.  No chest pressure.    Nuc stress myoview completed without complications.   Nuc results to follow.    LOS: 2 days   Shannon Fernandez,Shannon Fernandez 06/10/2012, 11:21 AM

## 2012-06-10 NOTE — Progress Notes (Signed)
Pt discharged home after receiving discharge instructions interpreted to her by her daughter. IV d/c'd, site u, tip intact. VSS. Taken to car via wheelchair by NT.

## 2012-06-11 ENCOUNTER — Other Ambulatory Visit (HOSPITAL_COMMUNITY): Payer: Self-pay | Admitting: Cardiology

## 2012-06-11 DIAGNOSIS — R0989 Other specified symptoms and signs involving the circulatory and respiratory systems: Secondary | ICD-10-CM

## 2012-06-11 DIAGNOSIS — I10 Essential (primary) hypertension: Secondary | ICD-10-CM

## 2012-06-11 NOTE — Discharge Summary (Signed)
Physician Discharge Summary  Patient ID: Shannon Fernandez MRN: [id number] DOB/AGE: Feb 02, 1951 62 y.o.  Admit date: 06/08/2012 Discharge date: 06/10/2012  Discharge Diagnoses:  Principal Problem:  *Elevated troponin, POC +, Troponin I negative-FALSE POS. poc Active Problems:  Abnormal EKG on admit, continues abnormal may be her normal EKG  Chest pressure, on admit atypical, GI in nature, Negative nuculear stress test  Epigastric pain after vomiting  Bruit, RFA  Leukocytosis on admission (? lab error)   Discharged Condition: good  Hospital Course: Shannon Fernandez is a 62 y.o. female who presents with a pressure like substernal chest pressure, onset 06/08/12 at 11 am, was RATED 6/10, after onset was associated with multiple non bloody non bilious emesis episodes the AM of admit. Patient denies recent DOE, or SOB. CP not really associated with SOB she stated.  In the ED patient was found to have a positive troponin of 0.11-POC, given her symptoms and the positive troponin the hospitalist admitted for SE per prior arrangement for patients NSTEMI.  Her follow up troponin Is were all negative.  Her EKG had deep T wave inversions in I and AVL.  She was in ICU overnight on IV Heparin and the next am underwent stress myoview.  This was negative for ischemia.  Pt had no further chest pressure, no nausea no vomiting.  On exam after stress test a renal artery bruit could be heard.    Her CT head revealed Calcified left cerebellopontine angle mass measuring 60 mm x 11 mm without mass effect or surrounding edema. This may represent postsurgical changes or a small calcified meningioma. Correlate with previous neurosurgical history.    Her 2D Echo:  - Left ventricle: The cavity size was normal. Wall thickness was increased in a pattern of mild LVH. Systolic function was normal. The estimated ejection fraction was in the range of 55% to 60%. Wall motion was normal; there were no regional wall motion  abnormalities. Doppler parameters are consistent with abnormal left ventricular relaxation (grade 1 diastolic dysfunction). The E/e' ratio is <10, suggesting normal LV filling pressure. - Left atrium: The atrium was normal in size. - Systemic veins: The IVC measures <1.2 cm and collapses spontaneously, suggsting volume depletion and a low RA pressure of 2 mmHg.  Additionally her BP with stress test elevated to 165/110 at one point.  Pt will follow up with Dr. Herbie Baltimore after the renal ultrasound.  At that time she may need to be referred back to neuro to evaluate CT of her head.    She was seen by Dr. Rennis Golden and was stable and ready for discharge home.  Consults: cardiology  Significant Diagnostic Studies:  BMET    Component Value Date/Time   NA 140 06/09/2012 0400   K 3.5 06/09/2012 0400   CL 105 06/09/2012 0400   CO2 27 06/09/2012 0400   GLUCOSE 89 06/09/2012 0400   BUN 12 06/09/2012 0400   CREATININE 0.57 06/09/2012 0400   CALCIUM 8.8 06/09/2012 0400   GFRNONAA >90 06/09/2012 0400   GFRAA >90 06/09/2012 0400    CBC    Component Value Date/Time   WBC 5.8 06/10/2012 0522   RBC 4.52 06/10/2012 0522   HGB 12.5 06/10/2012 0522   HCT 38.2 06/10/2012 0522   PLT 196 06/10/2012 0522   MCV 84.5 06/10/2012 0522   MCH 27.7 06/10/2012 0522   MCHC 32.7 06/10/2012 0522   RDW 13.6 06/10/2012 0522   LYMPHSABS 1.8 06/08/2012 1300   MONOABS 1.2* 06/08/2012 1300  EOSABS 0.2 06/08/2012 1300   BASOSABS 0.0 06/08/2012 1300   Troponin poc 0.11 all other troponin Is  <0.30 .  HGB A1C was 6.2 TSH  3.975  COMPLETE ABDOMINAL ULTRASOUND  Comparison: None.  Findings:  Gallbladder: No gallstones, gallbladder wall thickening, or  pericholecystic fluid.  Common bile duct: 4 mm, normal.  Liver: No focal mass lesion. Diffusely echogenic suggesting  hepatic steatosis.  IVC: Appears normal.  Pancreas: No focal abnormality seen.  Spleen: 8.6 cm. Normal echotexture.  Right Kidney: 13.4 cm. Normal echotexture. Normal central sinus  echo  complex. No calculi or hydronephrosis.  Left Kidney: 12.6 cm. Normal echotexture. Normal central sinus  echo complex. No calculi or hydronephrosis.  Abdominal aorta: No aneurysm identified.  IMPRESSION:  Negative for cholelithiasis or cholecystitis. Echogenic liver most  compatible with hepatic steatosis.  Discharge Exam: Blood pressure 120/96, pulse 64, temperature 97.9 F (36.6 C), temperature source Oral, resp. rate 19, height 5\' 2"  (1.575 m), weight 73.483 kg (162 lb), SpO2 96.00%.   PE: General:alert and oriented, translator here for visit and test.  Heart: S1S2 RRR no murmur or gallup  Lungs: clear without rales rhonchi or wheezes  ABD: soft, non tender + BS, ? Rt. Renal artery bruit- (exam at completion of exercise stress)  Ext. No edema     Disposition: 01-Home or Self Care     Medication List     As of 06/11/2012  8:13 AM    TAKE these medications         guaiFENesin 600 MG 12 hr tablet   Commonly known as: MUCINEX   Take 600 mg by mouth at bedtime.      pantoprazole 40 MG tablet   Commonly known as: PROTONIX   Take 1 tablet (40 mg total) by mouth daily at 6 (six) AM.       DISCHARGE INSTRUCTIONS:  Diet as tolerated.  We have scheduled you for an ultrasound of your renal arteries to check for a blockage.  Our Office, [facility] will call you with date and time.  You will then be scheduled for a follow up with Dr. Herbie Baltimore.   SignedLeone Brand 06/11/2012, 8:13 AM

## 2012-07-03 ENCOUNTER — Ambulatory Visit (HOSPITAL_COMMUNITY)
Admission: RE | Admit: 2012-07-03 | Discharge: 2012-07-03 | Disposition: A | Discharge status: Home / Self Care | Payer: Medicare Other | Source: Ambulatory Visit | Attending: Cardiology | Admitting: Cardiology

## 2012-07-03 DIAGNOSIS — I10 Essential (primary) hypertension: Secondary | ICD-10-CM

## 2012-07-03 DIAGNOSIS — R0989 Other specified symptoms and signs involving the circulatory and respiratory systems: Secondary | ICD-10-CM

## 2012-07-03 NOTE — Progress Notes (Signed)
Renal Duplex Completed. Shannon Fernandez  

## 2012-12-28 ENCOUNTER — Telehealth: Payer: Self-pay | Admitting: Cardiology

## 2012-12-28 NOTE — Telephone Encounter (Signed)
error 

## 2013-01-04 ENCOUNTER — Emergency Department (HOSPITAL_COMMUNITY): Payer: Medicare Other

## 2013-01-04 ENCOUNTER — Encounter (HOSPITAL_COMMUNITY): Payer: Self-pay

## 2013-01-04 ENCOUNTER — Emergency Department (HOSPITAL_COMMUNITY)
Admission: EM | Admit: 2013-01-04 | Discharge: 2013-01-04 | Disposition: A | Discharge status: Home / Self Care | Payer: Medicare Other | Attending: Emergency Medicine | Admitting: Emergency Medicine

## 2013-01-04 DIAGNOSIS — M549 Dorsalgia, unspecified: Secondary | ICD-10-CM

## 2013-01-04 DIAGNOSIS — Z79899 Other long term (current) drug therapy: Secondary | ICD-10-CM | POA: Insufficient documentation

## 2013-01-04 DIAGNOSIS — M545 Low back pain, unspecified: Secondary | ICD-10-CM | POA: Insufficient documentation

## 2013-01-04 MED ORDER — DIAZEPAM 5 MG/ML IJ SOLN
5.0000 mg | Freq: Once | INTRAMUSCULAR | Status: AC
  Administered 2013-01-04: 5 mg via INTRAVENOUS
  Filled 2013-01-04: qty 2

## 2013-01-04 MED ORDER — MORPHINE SULFATE 4 MG/ML IJ SOLN
4.0000 mg | Freq: Once | INTRAMUSCULAR | Status: AC
  Administered 2013-01-04: 4 mg via INTRAVENOUS
  Filled 2013-01-04: qty 1

## 2013-01-04 MED ORDER — KETOROLAC TROMETHAMINE 30 MG/ML IJ SOLN
30.0000 mg | Freq: Once | INTRAMUSCULAR | Status: AC
  Administered 2013-01-04: 30 mg via INTRAVENOUS
  Filled 2013-01-04: qty 1

## 2013-01-04 MED ORDER — DIAZEPAM 5 MG PO TABS: 5.0000 mg | ORAL_TABLET | Freq: Four times a day (QID) | ORAL | Status: AC | PRN

## 2013-01-04 MED ORDER — HYDROCODONE-ACETAMINOPHEN 5-325 MG PO TABS: ORAL_TABLET | ORAL | Status: AC

## 2013-01-04 MED ORDER — SODIUM CHLORIDE 0.9 % IV BOLUS (SEPSIS)
500.0000 mL | Freq: Once | INTRAVENOUS | Status: AC
  Administered 2013-01-04: 500 mL via INTRAVENOUS

## 2013-01-04 MED ORDER — ONDANSETRON HCL 4 MG/2ML IJ SOLN
4.0000 mg | Freq: Once | INTRAMUSCULAR | Status: AC
  Administered 2013-01-04: 4 mg via INTRAVENOUS
  Filled 2013-01-04: qty 2

## 2013-01-04 MED ORDER — DEXAMETHASONE SODIUM PHOSPHATE 10 MG/ML IJ SOLN
10.0000 mg | Freq: Once | INTRAMUSCULAR | Status: AC
  Administered 2013-01-04: 10 mg via INTRAVENOUS
  Filled 2013-01-04: qty 1

## 2013-01-04 MED ORDER — HYDROMORPHONE HCL PF 1 MG/ML IJ SOLN
1.0000 mg | Freq: Once | INTRAMUSCULAR | Status: AC
  Administered 2013-01-04: 1 mg via INTRAVENOUS
  Filled 2013-01-04: qty 1

## 2013-01-04 NOTE — ED Notes (Signed)
Pt. Ambulated with walker; pain with ambulation. Family at bedside.

## 2013-01-04 NOTE — ED Notes (Signed)
Tingling in her feet started 2 weeks ago.   And the severe back pain began 2 days ago.    Denies numbness or tingling in her extremetis presently.  Voiding without difficulty. BM today

## 2013-01-04 NOTE — ED Provider Notes (Signed)
CSN: [id number]     Arrival date & time 01/04/13  1139 History     First MD Initiated Contact with Patient 01/04/13 1150     Chief Complaint  Patient presents with  . Back Pain   (Consider location/radiation/quality/duration/timing/severity/associated sxs/prior Treatment) HPI  Shannon Fernandez is a 62 y.o. female with no significant past medical history complaining of worsening back pain over the course of 2 weeks significantly worsening over the last 2 days. Patient rates her pain as severe, 10 out of 10, has not taken pain medications because she "does not like to take medicine." Pain is located in the bilateral lower lumbar area, does not radiate down to the legs. Patient denies numbness, weakness, fever, change in bowel or bladder habits, history of IV drug use or cancer. Day before the onset of the pain patient was more active than normal, mopping and moving furniture vigorously.  Past Medical History  Diagnosis Date  . Abnormal EKG on admit 06/10/2012  . Chest pressure, on admit atypical 06/10/2012   Past Surgical History  Procedure Laterality Date  . Craniotomy  5/12    in [place]  . Back surgery     No family history on file. History  Substance Use Topics  . Smoking status: Never Smoker   . Smokeless tobacco: Not on file  . Alcohol Use: No   OB History   Grav Para Term Preterm Abortions TAB SAB Ect Mult Living                 Review of Systems  Constitutional: Negative for fever.  HENT: Negative for neck pain.   Gastrointestinal: Negative for nausea, vomiting and abdominal pain.  Genitourinary: Negative for dysuria and difficulty urinating.  Musculoskeletal: Positive for back pain.  Neurological: Negative for weakness and numbness.  All other systems reviewed and are negative.    Allergies  Review of patient's allergies indicates no known allergies.  Home Medications   Current Outpatient Rx  Name  Route  Sig  Dispense  Refill  . guaiFENesin (MUCINEX) 600 MG 12 hr  tablet   Oral   Take 600 mg by mouth at bedtime.         . pantoprazole (PROTONIX) 40 MG tablet   Oral   Take 1 tablet (40 mg total) by mouth daily at 6 (six) AM.   30 tablet   1    BP 142/63  Pulse 73  Temp(Src) 98.3 F (36.8 C) (Oral)  Resp 18  SpO2 97% Physical Exam  Nursing note and vitals reviewed. Constitutional: She is oriented to person, place, and time. She appears well-developed and well-nourished. No distress.  HENT:  Head: Normocephalic.  Mouth/Throat: Oropharynx is clear and moist.  Eyes: Conjunctivae and EOM are normal.  Neck: Normal range of motion.  Cardiovascular: Normal rate.   Pulmonary/Chest: Effort normal and breath sounds normal. No stridor. No respiratory distress. She has no wheezes. She has no rales. She exhibits no tenderness.  Abdominal: Soft. There is no tenderness.  Musculoskeletal: Normal range of motion.  Neurological: She is alert and oriented to person, place, and time. She displays normal reflexes.  Strength is 5/5 to the bilateral lower extremities. Sensory hallicus longus 5 out of 5 bilaterally. Distal sensation is grossly intact.  Psychiatric: She has a normal mood and affect.    ED Course   Procedures (including critical care time)  Labs Reviewed - No data to display Dg Lumbar Spine Complete  01/04/2013   *RADIOLOGY REPORT*  Clinical Data: 62 year old female with severe back pain.  History of prior lumbar surgery.  LUMBAR SPINE - COMPLETE 4+ VIEW  Comparison: None  Findings: Four non-rib bearing lumbar type vertebra are identified with a partially lumbarized S1.  Posterior and interbody fusion changes at L4-S1 noted. The upper lumbar disc spaces are unremarkable. There is no evidence of acute fracture or subluxation. No focal bony lesions are identified.  IMPRESSION: No evidence of acute abnormality.  Posterior fusion in the lower lumbar spine without complicating features.   Original Report Authenticated By: Harmon Pier, M.D.   1. Back  pain     MDM   Filed Vitals:   01/04/13 1142 01/04/13 1237 01/04/13 1300 01/04/13 1400  BP: 142/63 132/54 116/36 117/52  Pulse: 73  56 47  Temp: 98.3 F (36.8 C)     TempSrc: Oral     Resp: 18     SpO2: 97% 99% 100% 100%     Shannon Fernandez is a 62 y.o. female patient is able to cannulate with a walker. Plain film showed no abnormalities. Neurologic exam is normal, no concern for cauda equina. Patient is amenable to discharge and can be DC'd to home with good family supervision. Discussion of fall precautions.  This is a shared visit with the attending physician who personally evaluated the patient and agrees with the care plan.    Medications  dexamethasone (DECADRON) injection 10 mg (10 mg Intravenous Given 01/04/13 1228)  morphine 4 MG/ML injection 4 mg (4 mg Intravenous Given 01/04/13 1228)  ondansetron (ZOFRAN) injection 4 mg (4 mg Intravenous Given 01/04/13 1227)  sodium chloride 0.9 % bolus 500 mL (0 mLs Intravenous Stopped 01/04/13 1503)  diazepam (VALIUM) injection 5 mg (5 mg Intravenous Given 01/04/13 1228)  ketorolac (TORADOL) 30 MG/ML injection 30 mg (30 mg Intravenous Given 01/04/13 1227)  HYDROmorphone (DILAUDID) injection 1 mg (1 mg Intravenous Given 01/04/13 1401)  diazepam (VALIUM) injection 5 mg (5 mg Intravenous Given 01/04/13 1401)    Pt is hemodynamically stable, appropriate for, and amenable to discharge at this time. Pt verbalized understanding and agrees with care plan. All questions answered. Outpatient follow-up and specific return precautions discussed.    New Prescriptions   DIAZEPAM (VALIUM) 5 MG TABLET    Take 1 tablet (5 mg total) by mouth every 6 (six) hours as needed (muscle spasms).   HYDROCODONE-ACETAMINOPHEN (NORCO/VICODIN) 5-325 MG PER TABLET    Take 1-2 tablets by mouth every 6 hours as needed for pain.    Note: Portions of this report may have been transcribed using voice recognition software. Every effort was made to ensure accuracy; however, inadvertent  computerized transcription errors may be present    Wynetta Emery, PA-C 01/04/13 1514

## 2013-01-04 NOTE — ED Provider Notes (Signed)
Medical screening examination/treatment/procedure(s) were conducted as a shared visit with non-physician practitioner(s) and myself.  I personally evaluated the patient during the encounter  Shannon Fernandez is a 62 y.o. female hx of back surgery here with back pain. Back pain after moving furniture 2 days ago. No falls or injury. Xray showed stable rods. Pain improved with pain meds. D/c home on motrin, norco, valium.    Richardean Canal, MD 01/04/13 2012

## 2013-04-08 ENCOUNTER — Other Ambulatory Visit: Payer: Self-pay

## 2014-02-12 IMAGING — US US ABDOMEN COMPLETE
1 series · 14 of 25 positions shown · non-contrast
Comparison: None.

CLINICAL DATA: Chest pain.  Abdominal pain.

COMPLETE ABDOMINAL ULTRASOUND

[Series 1: us abdomen complete · 0.25mm/px · 14 of 76 slices shown]
[im 1/76]
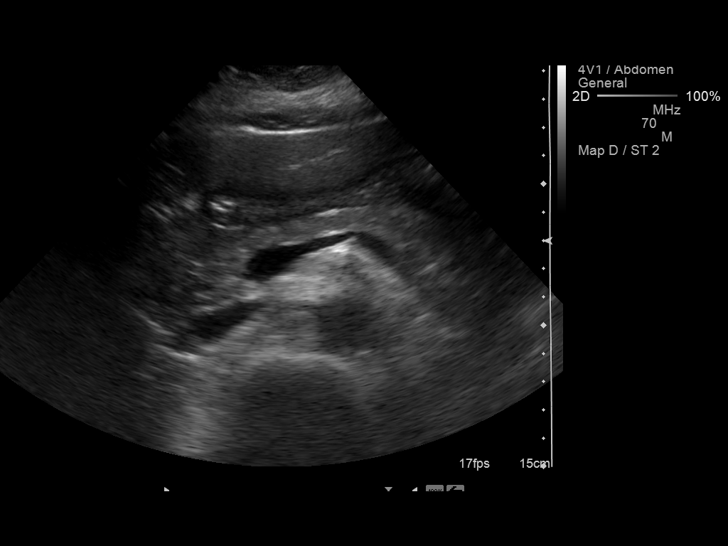
[im 7/76]
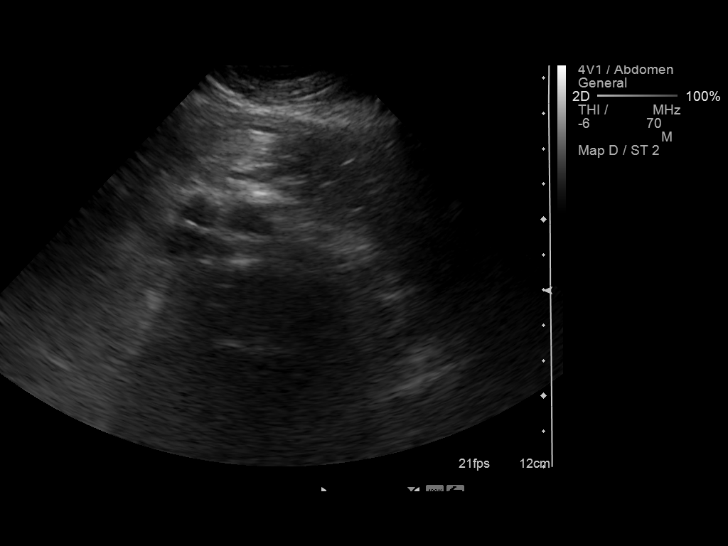
[im 13/76]
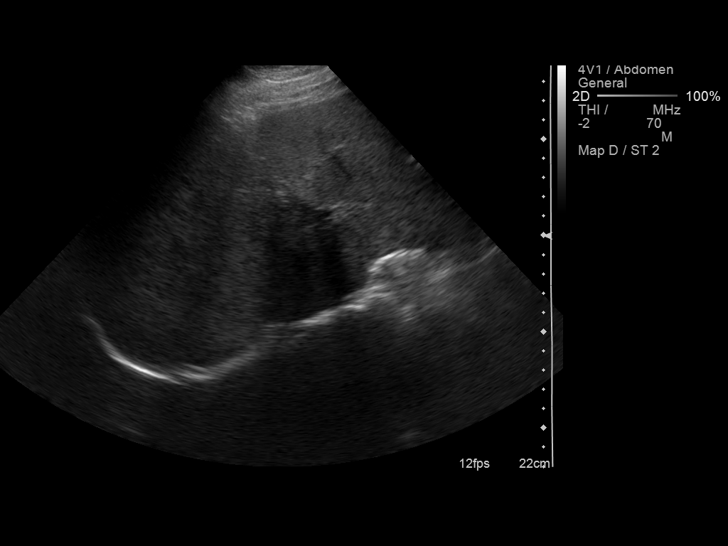
[im 19/76]
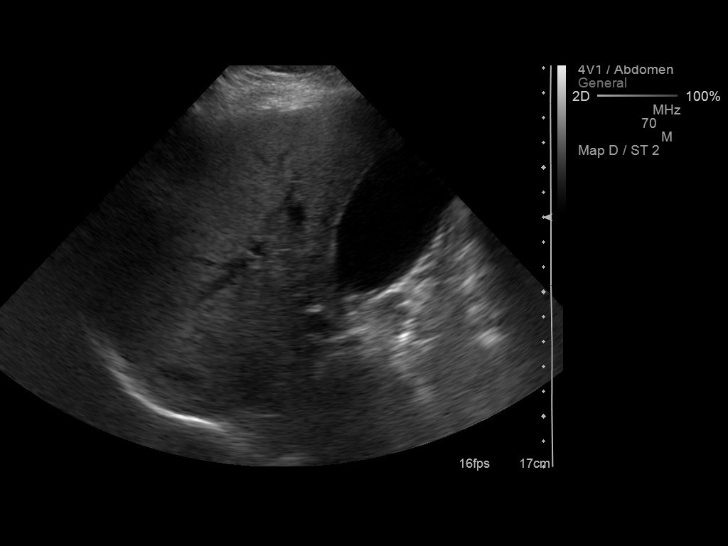
[im 26/76]
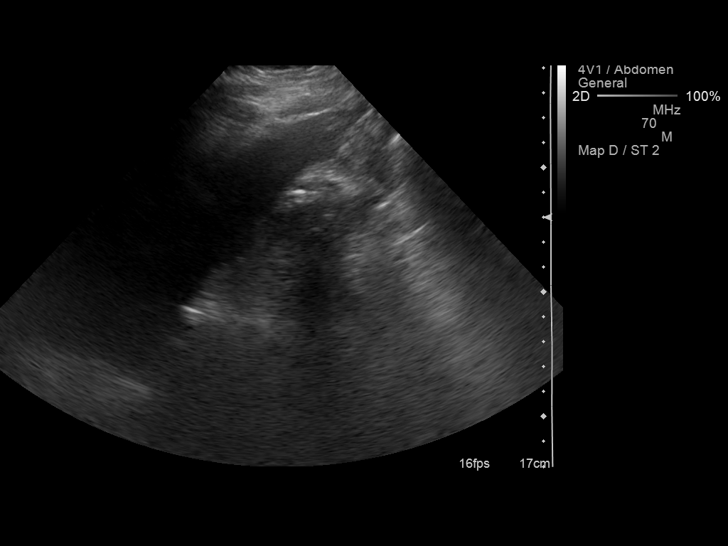
[im 29/76]
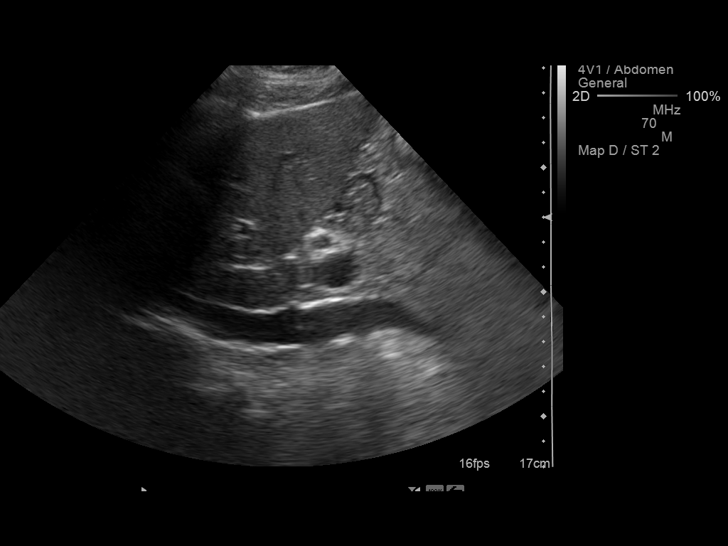
[im 35/76]
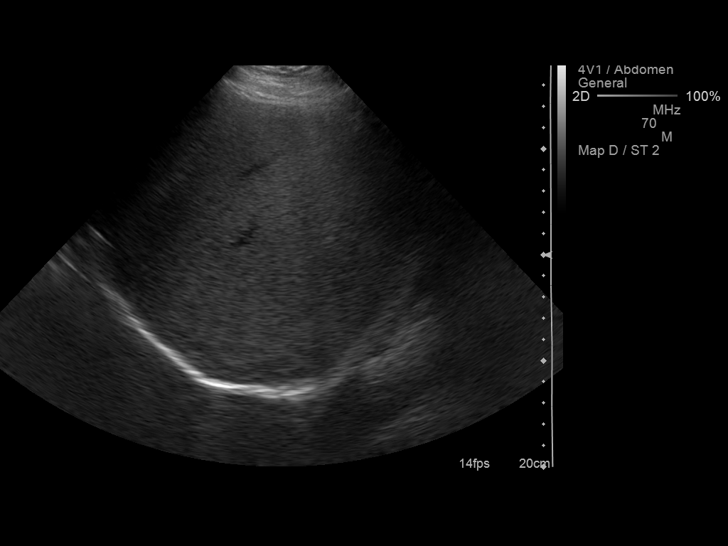
[im 41/76]
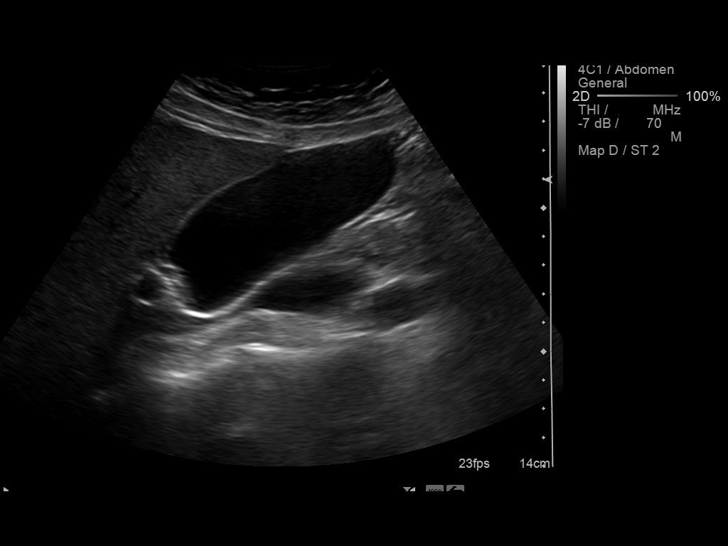
[im 47/76]
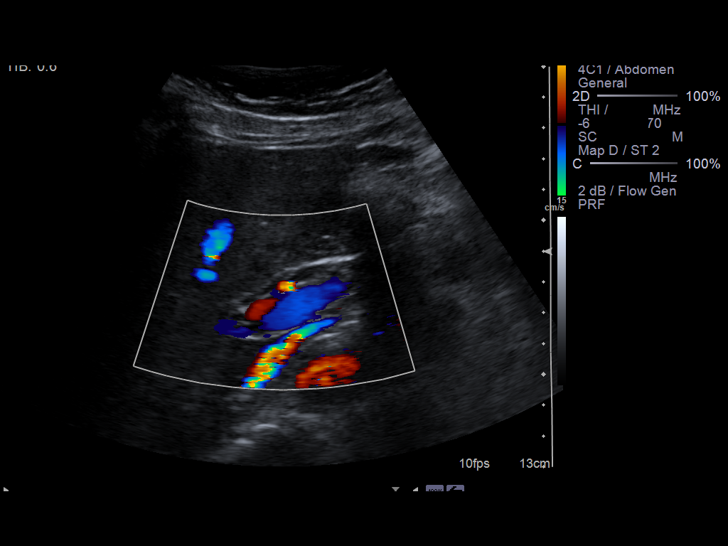
[im 51/76]
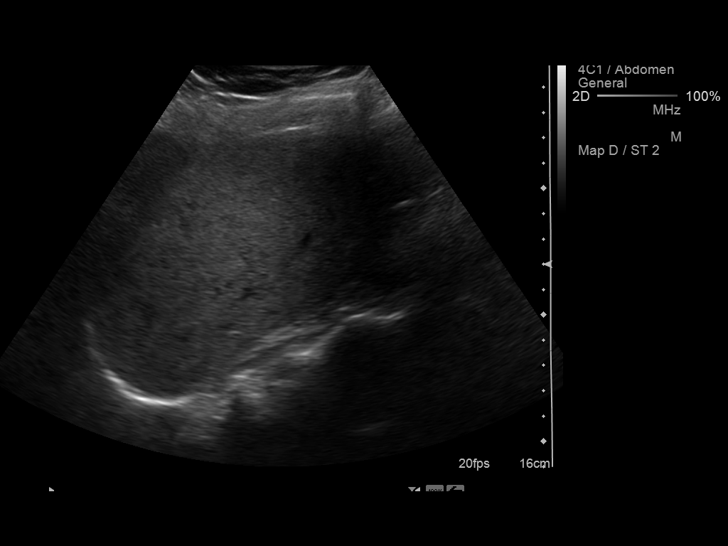
[im 57/76]
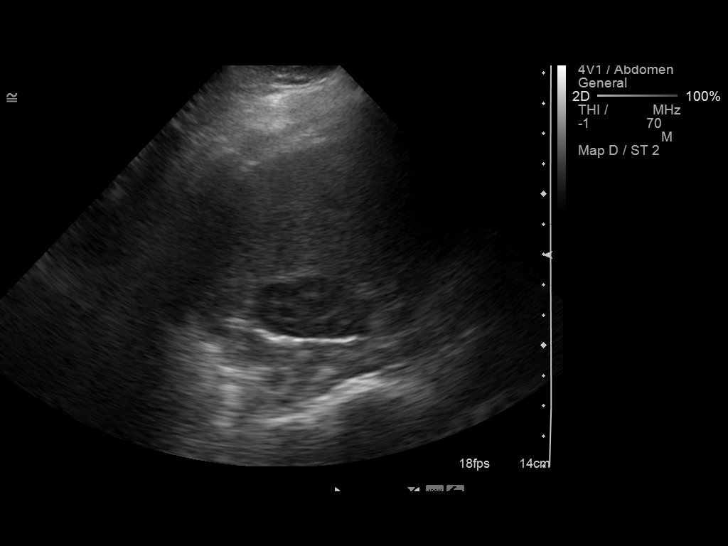
[im 63/76]
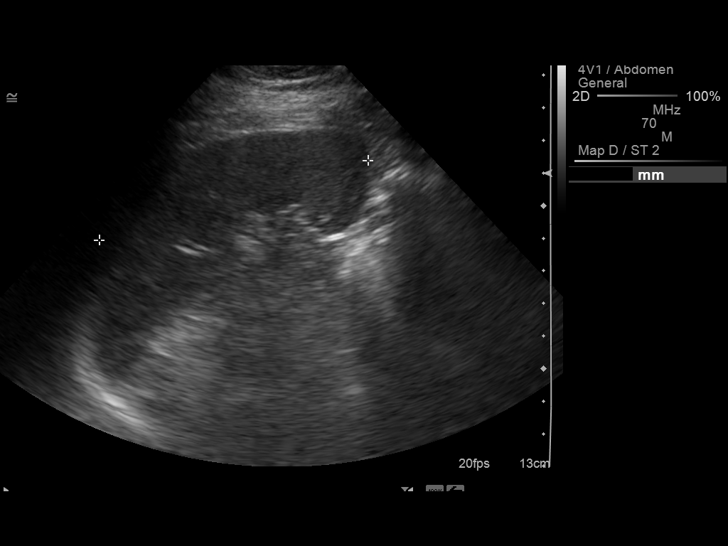
[im 69/76]
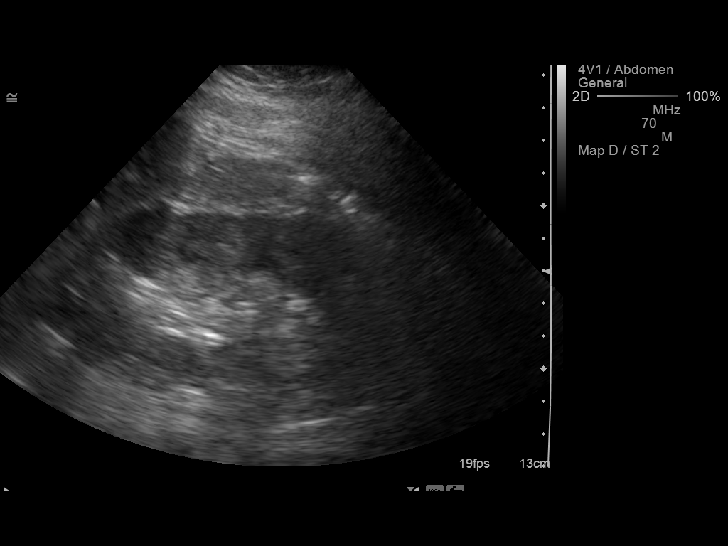
[im 76/76]
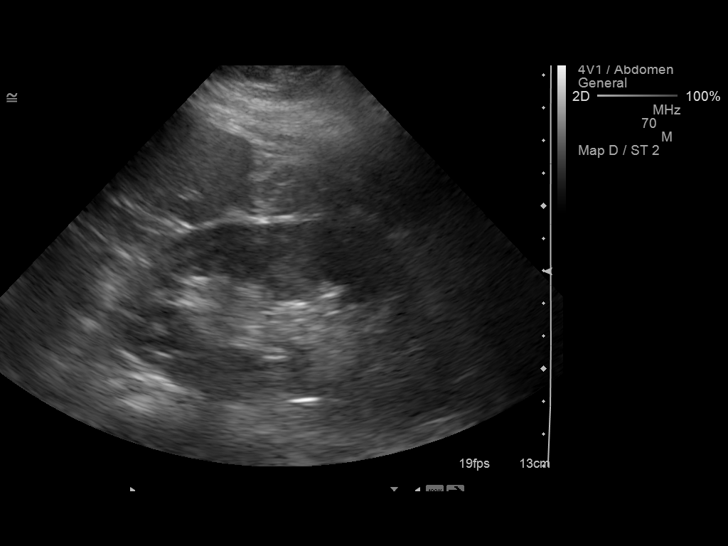

[14 of 25 positions shown; findings below may reference images not displayed]

FINDINGS: Gallbladder:  No gallstones, gallbladder wall thickening, or
pericholecystic fluid.

Common bile duct:  4 mm, normal.

Liver:  No focal mass lesion.  Diffusely echogenic suggesting
hepatic steatosis.

IVC:  Appears normal.

Pancreas:  No focal abnormality seen.

Spleen:  8.6 cm.  Normal echotexture.

Right Kidney:  13.4 cm. Normal echotexture.  Normal central sinus
echo complex.  No calculi or hydronephrosis.

Left Kidney:  12.6 cm. Normal echotexture.  Normal central sinus
echo complex.  No calculi or hydronephrosis.

Abdominal aorta:  No aneurysm identified.
IMPRESSION: Negative for cholelithiasis or cholecystitis.  Echogenic liver most
compatible with hepatic steatosis.

## 2014-08-21 ENCOUNTER — Encounter (HOSPITAL_COMMUNITY): Payer: Self-pay | Admitting: Emergency Medicine

## 2014-08-21 ENCOUNTER — Emergency Department (HOSPITAL_COMMUNITY)
Admission: EM | Admit: 2014-08-21 | Discharge: 2014-08-21 | Disposition: A | Discharge status: Home / Self Care | Payer: Medicare Other | Attending: Emergency Medicine | Admitting: Emergency Medicine

## 2014-08-21 DIAGNOSIS — M5441 Lumbago with sciatica, right side: Secondary | ICD-10-CM | POA: Insufficient documentation

## 2014-08-21 DIAGNOSIS — Z79899 Other long term (current) drug therapy: Secondary | ICD-10-CM | POA: Insufficient documentation

## 2014-08-21 DIAGNOSIS — Z9889 Other specified postprocedural states: Secondary | ICD-10-CM | POA: Diagnosis not present

## 2014-08-21 DIAGNOSIS — M545 Low back pain: Secondary | ICD-10-CM | POA: Diagnosis present

## 2014-08-21 HISTORY — DX: Dorsalgia, unspecified: M54.9

## 2014-08-21 MED ORDER — DIAZEPAM 5 MG PO TABS: 5.0000 mg | ORAL_TABLET | Freq: Two times a day (BID) | ORAL | Status: AC | PRN

## 2014-08-21 MED ORDER — OXYCODONE-ACETAMINOPHEN 5-325 MG PO TABS
1.0000 | ORAL_TABLET | Freq: Four times a day (QID) | ORAL | Status: AC | PRN
Start: 2014-08-21 — End: ?

## 2014-08-21 MED ORDER — MORPHINE SULFATE 4 MG/ML IJ SOLN
4.0000 mg | Freq: Once | INTRAMUSCULAR | Status: AC
  Administered 2014-08-21: 4 mg via INTRAMUSCULAR
  Filled 2014-08-21: qty 1

## 2014-08-21 MED ORDER — IBUPROFEN 800 MG PO TABS: 800.0000 mg | ORAL_TABLET | Freq: Three times a day (TID) | ORAL | Status: AC

## 2014-08-21 MED ORDER — KETOROLAC TROMETHAMINE 60 MG/2ML IM SOLN
60.0000 mg | Freq: Once | INTRAMUSCULAR | Status: AC
Start: 2014-08-21 — End: 2014-08-21
  Administered 2014-08-21: 60 mg via INTRAMUSCULAR
  Filled 2014-08-21: qty 2

## 2014-08-21 MED ORDER — DIAZEPAM 5 MG PO TABS
5.0000 mg | ORAL_TABLET | Freq: Once | ORAL | Status: AC
  Administered 2014-08-21: 5 mg via ORAL
  Filled 2014-08-21: qty 1

## 2014-08-21 NOTE — ED Notes (Signed)
Pt c/o low back pain that radiates down B/L legs R>L. [place], [place] was [place] and [place] while [place] country.

## 2014-08-21 NOTE — Discharge Instructions (Signed)
Take Valium as needed as directed for muscle spasm. No driving or operating heavy machinery while taking valium. This medication may cause drowsiness. Take percocet for severe pain only. No driving or operating heavy machinery while taking percocet. This medication may cause drowsiness. Take ibuprofen as directed. Rest, avoid heavy lifting or hard physical activity.  Citica  (Sciatica)  La citica es Conservation officer, historic buildings, debilidad, entumecimiento u hormigueo a lo largo del nervio citico. El nervio comienza en la zona inferior de la espalda y desciende por la parte posterior de cada pierna. El nervio controla los msculos de la parte inferior de la pierna y de la zona posterior de la rodilla, y transmite la sensibilidad a la parte posterior del muslo, la pierna y la planta del pie. La citica es un sntoma de otras afecciones mdicas. Por ejemplo, un dao a los nervios o algunas enfermedades como un disco herniado o un espoln seo en la columna vertebral, podran daarle o presionar en el nervio citico. Esto causa dolor, debilidad y otras sensaciones normalmente asociadas con la citica. Generalmente la citica afecta slo un lado del cuerpo. CAUSAS   Disco herniado o desplazado.  Enfermedad degenerativa del disco.  Un sndrome doloroso que compromete un msculo angosto de los glteos (sndrome piriforme).  Lesin o fractura plvica.  Embarazo.  Tumor (casos raros). SNTOMAS  Los sntomas pueden variar de leves a muy graves. Por lo general, los sntomas descienden desde la zona lumbar a las nalgas y la parte posterior de la pierna. Ellos son:   Hormigueo leve o dolor sordo en la parte inferior de la espalda, la pierna o la cadera.  Adormecimiento en la parte posterior de la pantorrilla o la planta del pie.  Sensacin de [employer] zona lumbar, la pierna o la cadera.  Dolor agudo en la zona inferior de la espalda, la pierna o la cadera.  Debilidad en las piernas.  Dolor de espalda intenso  que [employer] movimientos. Los sntomas pueden empeorar al toser, Brewing technologist, rer o estar sentado o parado durante [employer]. Adems, el sobrepeso puede empeorar los sntomas.  DIAGNSTICO  Su mdico le har un examen fsico para buscar los sntomas comunes de la citica. Le pedir que haga algunos movimientos o actividades que activaran el dolor del nervio citico. Para encontrar las causas de la citica podr indicarle otros estudios. Estos pueden ser:   Anlisis de [place].  Radiografas.  Pruebas de diagnstico por imgenes, como resonancia magntica o tomografa computada. TRATAMIENTO  El tratamiento se dirige a las causas de la citica. A veces, el tratamiento no es necesario, y Conservation officer, historic buildings y Health and safety inspector desaparecen por s mismos. Si necesita tratamiento, su mdico puede sugerir:   Medicamentos de venta libre para Best boy.  Medicamentos recetados, como antiinflamatorios, relajantes musculares o narcticos.  Aplicacin de calor o hielo en la zona del dolor.  Inyecciones de corticoides para disminuir el dolor, la irritacin y la inflamacin alrededor del nervio.  Reduccin de la [employer] perodos de [place].  Ejercicios y estiramiento del abdomen para fortalecer y Teacher, English as a foreign language la flexibilidad de la columna vertebral. Su mdico puede sugerirle perder peso si el peso extra empeora el dolor de espalda.  Fisioterapia.  La ciruga para eliminar lo que presiona o pincha el nervio, como un espoln seo o parte de una hernia de disco. INSTRUCCIONES PARA EL CUIDADO EN EL HOGAR   Slo tome medicamentos de venta libre o recetados para Glass blower/designer o Health and safety inspector, segn las  indicaciones de su mdico.  Aplique hielo sobre el rea dolorida durante 20 minutos 3-4 veces por [employer] primeras 48-72 horas. Luego intente aplicar calor de la misma manera.  Haga ejercicios, elongue o realice sus actividades habituales, si no le causan ms dolor.  Cumpla con todas las sesiones  de fisioterapia, segn le indique su mdico.  Cumpla con todas las visitas de control, segn le indique su mdico.  No use tacones altos o zapatos que no tengan buen apoyo.  Verifique que el colchn no sea muy blando. Un colchn firme Best boy y las [place]. SOLICITE ATENCIN MDICA DE INMEDIATO SI:   Pierde el control de la vejiga o del intestino (incontinencia).  Aumenta la debilidad en la zona inferior de la espalda, la pelvis, las nalgas o las piernas.  Siente irritacin o inflamacin en la espalda.  Tiene sensacin de ardor al [employer].  El dolor empeora cuando se acuesta o lo despierta por la noche.  El dolor es peor del que experiment en el pasado.  Dura ms de 4 semanas.  Pierde peso sin motivo de [place] sbita. ASEGRESE DE QUE:   Comprende estas instrucciones.  Controlar su enfermedad.  Solicitar ayuda de inmediato si no mejora o si empeora. Document Released: 05/20/2005 Document Revised: 11/19/2011 [facility] Patient Information 2015 [place], [place]. This information is not intended to replace advice given to you by your health care provider. Make sure you discuss any questions you have with your health care provider.  Dolor de espalda, adultos  (Back Pain, Adult)  El dolor de espalda es frecuente. Con frecuencia mejora luego de algn tiempo. La causa suele no ser un peligro para la vida. La mayora de las personas aprende a controlarlo por sus propios medios.  CUIDADOS EN EL HOGAR   Mantngase fsicamente activo. Si puede, comience a dar cortas caminatas en un suelo plano. Trate de caminar un poco ms [place].  Nopermanezca sentado, de pie ni conduzca automviles durante ms de 30 minutos seguidos. Nose quede en la cama.  Noevite los ejercicios ni el trabajo. La actividad puede ayudar a que la espalda se cure ms rpido.  Tenga cuidado al inclinarse o al levantar un objeto. Doble las rodillas, mantenga el [place] cerca de su cuerpo y no  gire.  Duerma sobre un [employer]. Acustese sobre un lado y [place]. Si se [employer], coloque una almohada debajo de las rodillas.  Tome la medicacin slo como le haya indicado el mdico.  Aplique hielo sobre la zona lesionada.  Ponga el hielo en una bolsa plstica.  Colquese una toalla entre la piel y la bolsa de hielo.  Deje la bolsa de hielo durante 15 a 42minutos 3 a 4 veces por da, durante los primeros 2 o 3 das. Luego puede ir alternando entre hielo y compresas calientes.  Consulte a su mdico sobre cul ejercicios o Proofreader.  Evite sentirse ansioso o estresado. Encuentre la forma de enfrentar el estrs, como por Museum/gallery curator ejercicios. SOLICITE AYUDA DE INMEDIATO SI:   El dolor no desaparece aunque haga reposo o tome medicamentos para Conservation officer, historic buildings.  El dolor no desaparece en una semana.  Tiene nuevos problemas.  No se siente mejor.  El dolor se extiende a las piernas.  No puede controlar la orina o la materia fecal.  Siente que los brazos estn dbiles o pierde la sensibilidad (estn adormecidos).  Tiene Higher education careers adviser (nuseas) y vmitos.  Siente dolor abdominal.  Bobbie Stack  que se desvanece (se desmaya). ASEGRESE DE QUE:   Comprende estas instrucciones.  Controlar su enfermedad.  Solicitar ayuda de inmediato si no mejora o si empeora. Document Released: 12/03/2010 Document Revised: 08/12/2011 [facility] Patient Information 2015 [place]. This information is not intended to replace advice given to you by your health care provider. Make sure you discuss any questions you have with your health care provider.

## 2014-08-21 NOTE — ED Provider Notes (Signed)
CSN: [id number]     Arrival date & time 08/21/14  1535 History  This chart was scribed for non-physician practitioner, Lucien Mons, PA-C,working with Noemi Chapel, MD, by Marlowe Kays, ED Scribe. This patient was seen in room TR11C/TR11C and the patient's care was started at 4:29 PM.  Chief Complaint  Patient presents with  . Back Pain   Patient is a 64 y.o. female presenting with back pain. The history is provided by the patient and medical records. No language interpreter was used.  Back Pain Associated symptoms: no fever, no numbness and no weakness     HPI Comments:  Shannon Fernandez is a 64 y.o. female with PMHx of back surgeries, 9 and 10 years ago, who presents to the Emergency Department complaining of severe, sharp lower back pain that radiates down the back of her right leg for the past three weeks. She reports having her surgeries in [place], [place] and has not had follow up here in [place] except for her PCP. Pt states she was visiting out of the country and went to an Emergency Department and received "injections" of some type that helped to alleviate the pain, but it has since returned. Pt takes Ibuprofen and Percocet (she received from a family member) with no significant relief of the pain. Any type of movement makes the pain worse. Denies alleviating factors. Denies fever, chills, bowel or bladder incontinence, numbness, tingling or weakness of the lower extremities.   Past Medical History  Diagnosis Date  . Abnormal EKG on admit 06/10/2012  . Chest pressure, on admit atypical 06/10/2012  . Back pain    Past Surgical History  Procedure Laterality Date  . Craniotomy  5/12    in [place]  . Back surgery    . Back surgery      preported rods with last back surgery   No family history on file. History  Substance Use Topics  . Smoking status: Never Smoker   . Smokeless tobacco: Not on file  . Alcohol Use: No   OB History    No data available     Review of Systems  Constitutional:  Negative for fever and chills.  Genitourinary:       No bowel or bladder incontinence  Musculoskeletal: Positive for back pain.  Neurological: Negative for weakness and numbness.  All other systems reviewed and are negative.   Allergies  Review of patient's allergies indicates no known allergies.  Home Medications   Prior to Admission medications   Medication Sig Start Date End Date Taking? Authorizing Provider  diazepam (VALIUM) 5 MG tablet Take 1 tablet (5 mg total) by mouth every 6 (six) hours as needed (muscle spasms). 01/04/13   Nicole Pisciotta, PA-C  diazepam (VALIUM) 5 MG tablet Take 1 tablet (5 mg total) by mouth every 12 (twelve) hours as needed for muscle spasms. 08/21/14   Carman Ching, PA-C  HYDROcodone-acetaminophen (NORCO/VICODIN) 5-325 MG per tablet Take 1-2 tablets by mouth every 6 hours as needed for pain. 01/04/13   Nicole Pisciotta, PA-C  ibuprofen (ADVIL,MOTRIN) 800 MG tablet Take 1 tablet (800 mg total) by mouth 3 (three) times daily. 08/21/14   Carman Ching, PA-C  oxyCODONE-acetaminophen (PERCOCET) 5-325 MG per tablet Take 1-2 tablets by mouth every 6 (six) hours as needed for severe pain. 08/21/14   Carman Ching, PA-C   Triage Vitals: BP 141/87 mmHg  Pulse 74  Temp(Src) 97.5 F (36.4 C) (Oral)  Resp 22  Ht 5\' 1"  (1.549 m)  Wt 180 lb (81.647 kg)  BMI 34.03 kg/m2  SpO2 99% Physical Exam  Constitutional: She is oriented to person, place, and time. She appears well-developed and well-nourished. No distress.  HENT:  Head: Normocephalic and atraumatic.  Mouth/Throat: Oropharynx is clear and moist.  Eyes: Conjunctivae are normal.  Neck: Normal range of motion. Neck supple. No spinous process tenderness and no muscular tenderness present.  Cardiovascular: Normal rate, regular rhythm and normal heart sounds.   Pulmonary/Chest: Effort normal and breath sounds normal. No respiratory distress.  Musculoskeletal: She exhibits no edema.  TTP R lumbar paraspinal muscles into  R buttock. Positive SLR on right.  Neurological: She is alert and oriented to person, place, and time. She has normal strength.  Strength lower extremities 5/5 and equal bilateral. Sensation intact. Gait not assessed on initial exam.  Skin: Skin is warm and dry. No rash noted. She is not diaphoretic.  Psychiatric: She has a normal mood and affect. Her behavior is normal.  Nursing note and vitals reviewed.   ED Course  Procedures (including critical care time) DIAGNOSTIC STUDIES: Oxygen Saturation is 99% on RA, normal by my interpretation.   COORDINATION OF CARE: 4:34 PM- Will refer to back specialist and order Toradol and Valium and re-evaluate pain. Pt verbalizes understanding and agrees to plan.  5:12 PM-Pt states some of her pain has been relieved but still is experiencing a great deal of pain. Will order Morphine injection and recheck.  5:40 PM- Pain controlled. Ambulates without difficulty. Will d/c pt.  Medications  diazepam (VALIUM) tablet 5 mg (5 mg Oral Given 08/21/14 1645)  ketorolac (TORADOL) injection 60 mg (60 mg Intramuscular Given 08/21/14 1645)  morphine 4 MG/ML injection 4 mg (4 mg Intramuscular Given 08/21/14 1723)    Labs Review Labs Reviewed - No data to display  Imaging Review No results found.   EKG Interpretation None      MDM   Final diagnoses:  Right-sided low back pain with right-sided sciatica   On toxic appearing, NAD. AF VSS. No red flags concerning patient's back pain. No s/s of central cord compression or cauda equina. Lower extremities are neurovascularly intact. History of known back issues as stated above. After pain control, patient ambulated without difficulty. Will d/c home with valium, percocet and ibuprofen. Advised f/u with PCP, and referred to back specialist for ultimate management of ongoing back problems. Stable for d/c. Return precautions given. Patient states understanding of treatment care plan and is agreeable.  I personally  performed the services described in this documentation, which was scribed in my presence. The recorded information has been reviewed and is accurate.    Carman Ching, PA-C 08/21/14 1748  Noemi Chapel, MD 08/22/14 [phone]

## 2014-08-21 NOTE — ED Notes (Signed)
Pt ambulated to bathroom with one assist. Pt reports pain present in RT leg with ambulation.

## 2014-08-21 NOTE — ED Notes (Signed)
Pt took Oxycodone last night around 9:30pm.  Last Ibuprofen yesterday around 4pm,

## 2014-09-05 ENCOUNTER — Ambulatory Visit
Admission: RE | Admit: 2014-09-05 | Discharge: 2014-09-05 | Disposition: A | Discharge status: Home / Self Care | Payer: Medicare Other | Source: Ambulatory Visit | Attending: Neurosurgery | Admitting: Neurosurgery

## 2014-09-05 ENCOUNTER — Other Ambulatory Visit: Payer: Self-pay | Admitting: Neurosurgery

## 2014-09-05 ENCOUNTER — Other Ambulatory Visit: Payer: Self-pay | Admitting: Pediatrics

## 2014-09-05 DIAGNOSIS — R52 Pain, unspecified: Secondary | ICD-10-CM

## 2016-05-11 IMAGING — CR DG KNEE COMPLETE 4+V*R*
4 series · 4 of 4 positions shown · non-contrast
Comparison: None.

CLINICAL DATA: Pain and swelling right knee for 2 months, no known
injury

EXAM:
RIGHT KNEE - COMPLETE 4+ VIEW

[w knee ap right (1 of 2)]
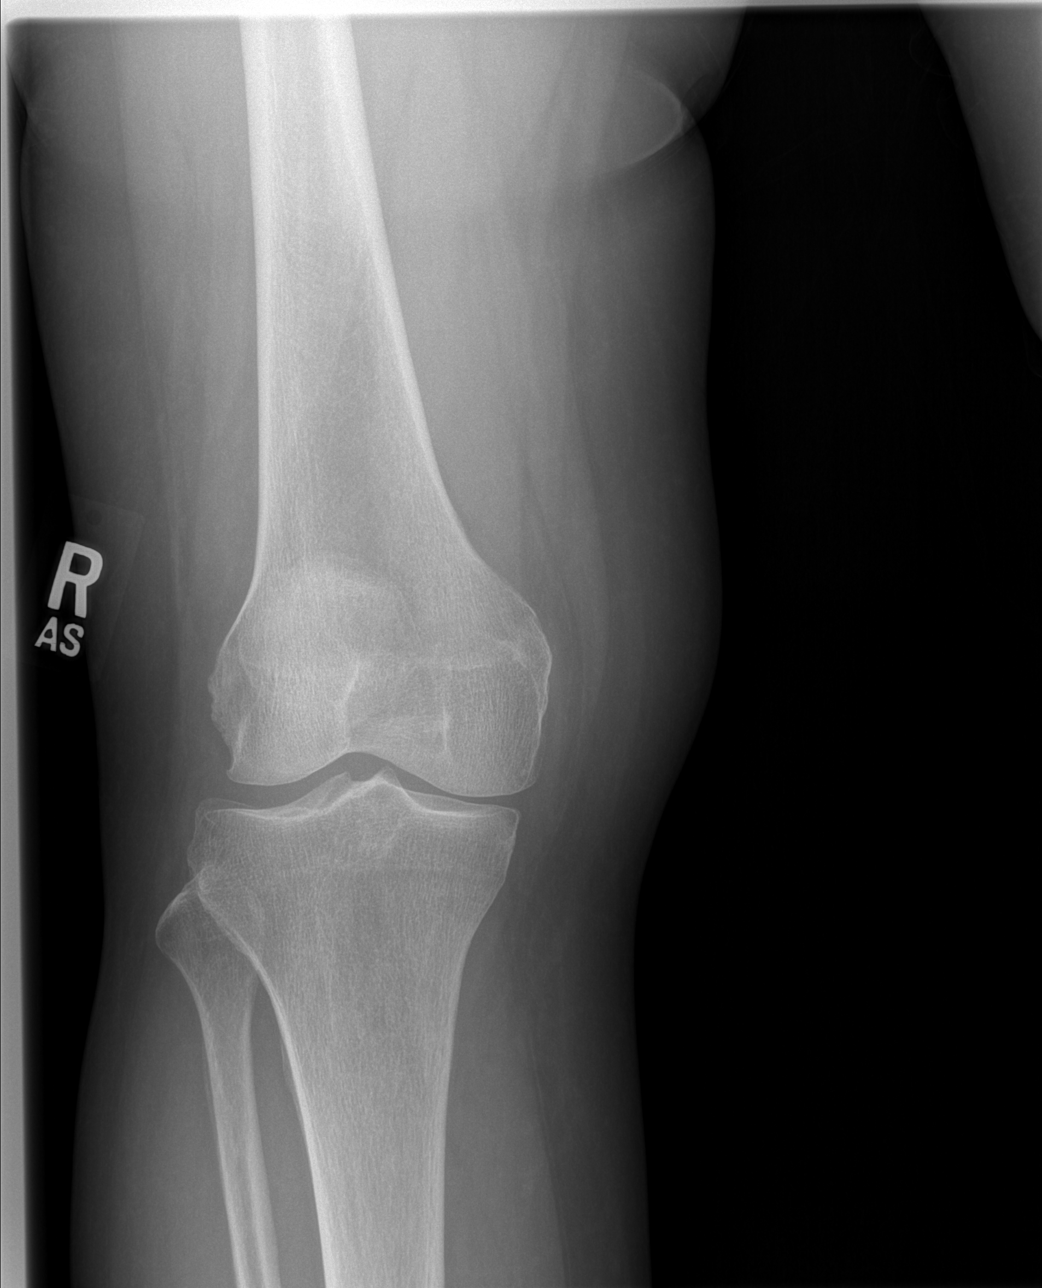

[w knee lat. right]
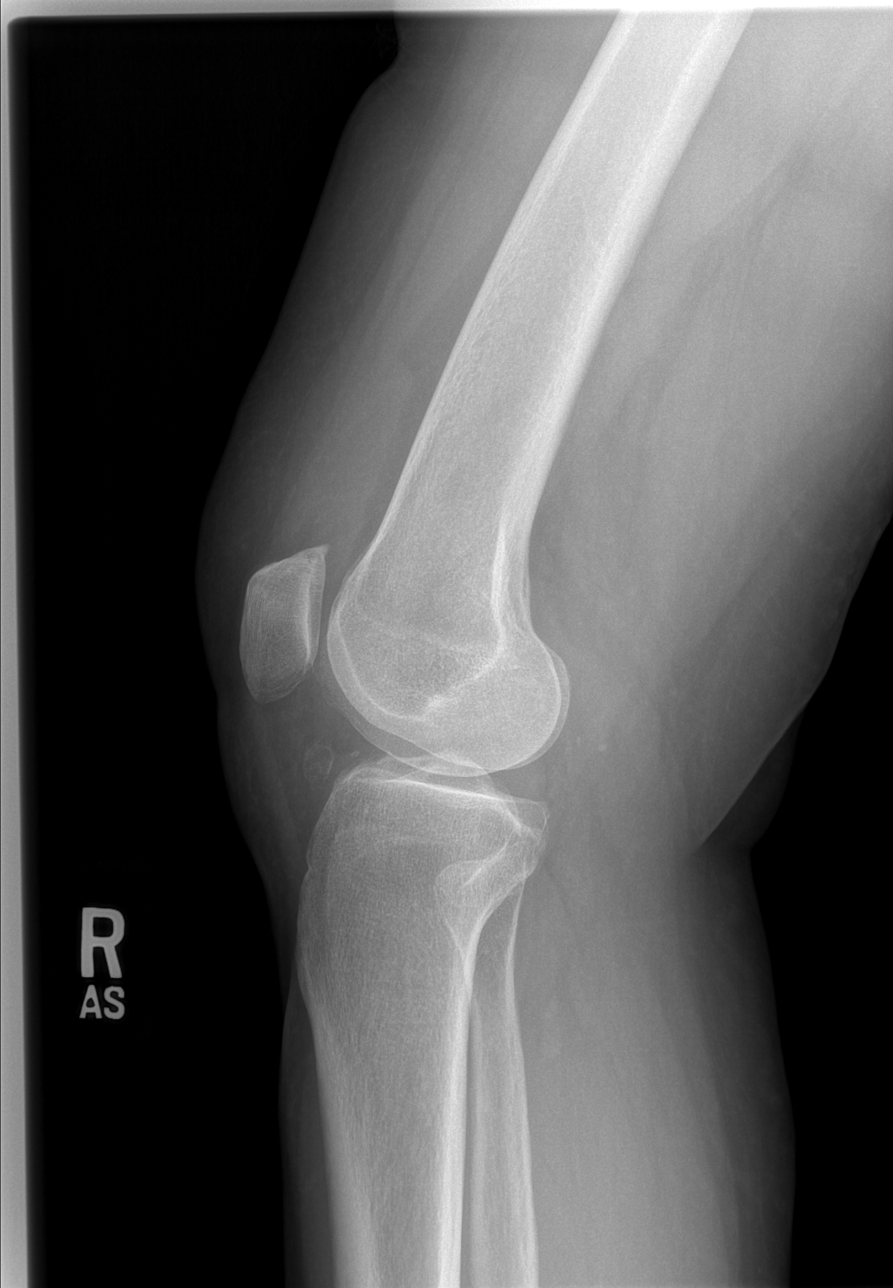

[w knee ap right (2 of 2)]
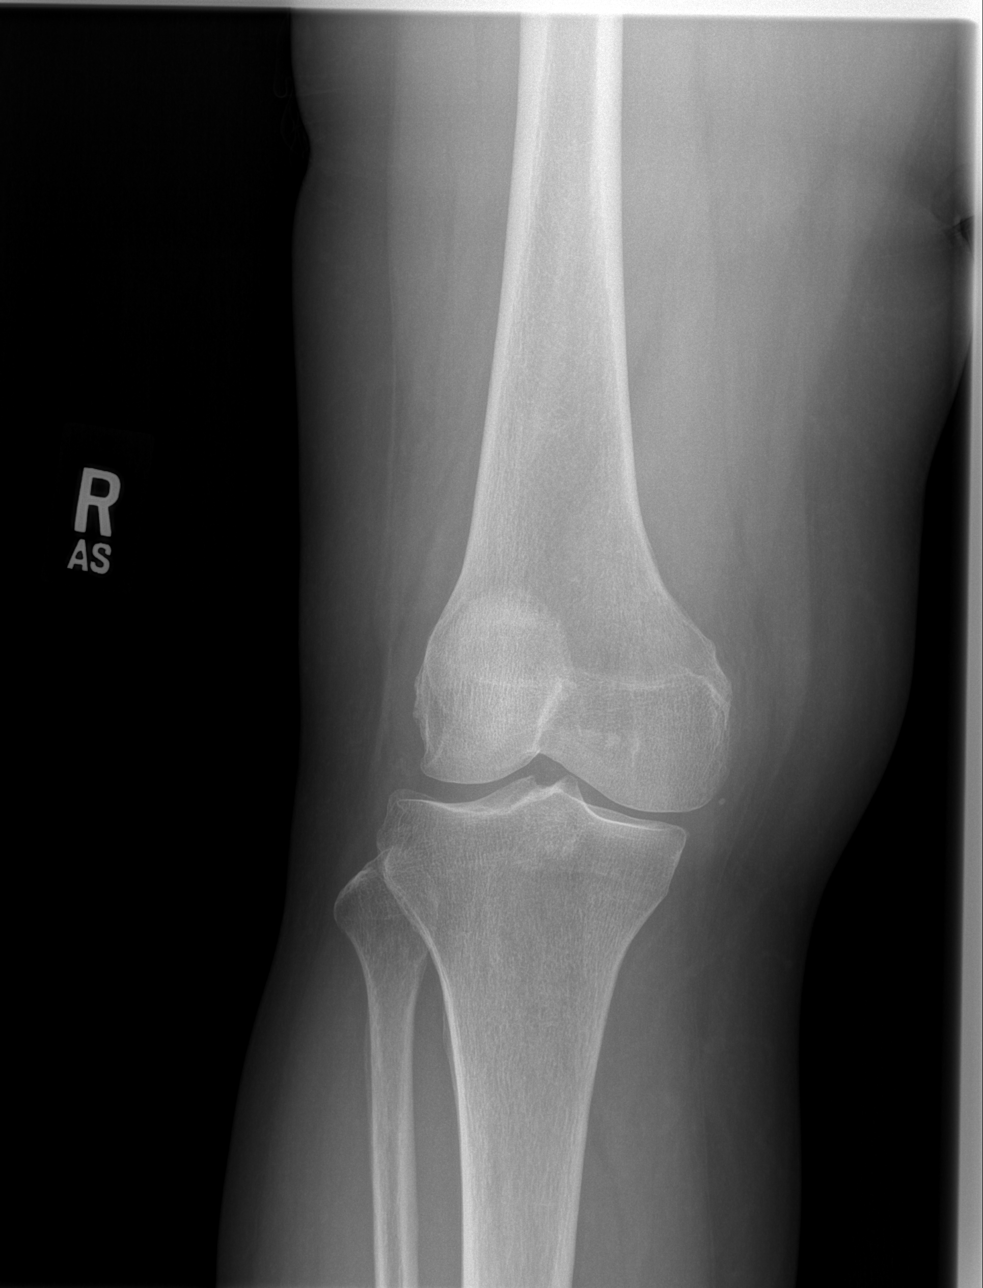

[view not recorded]
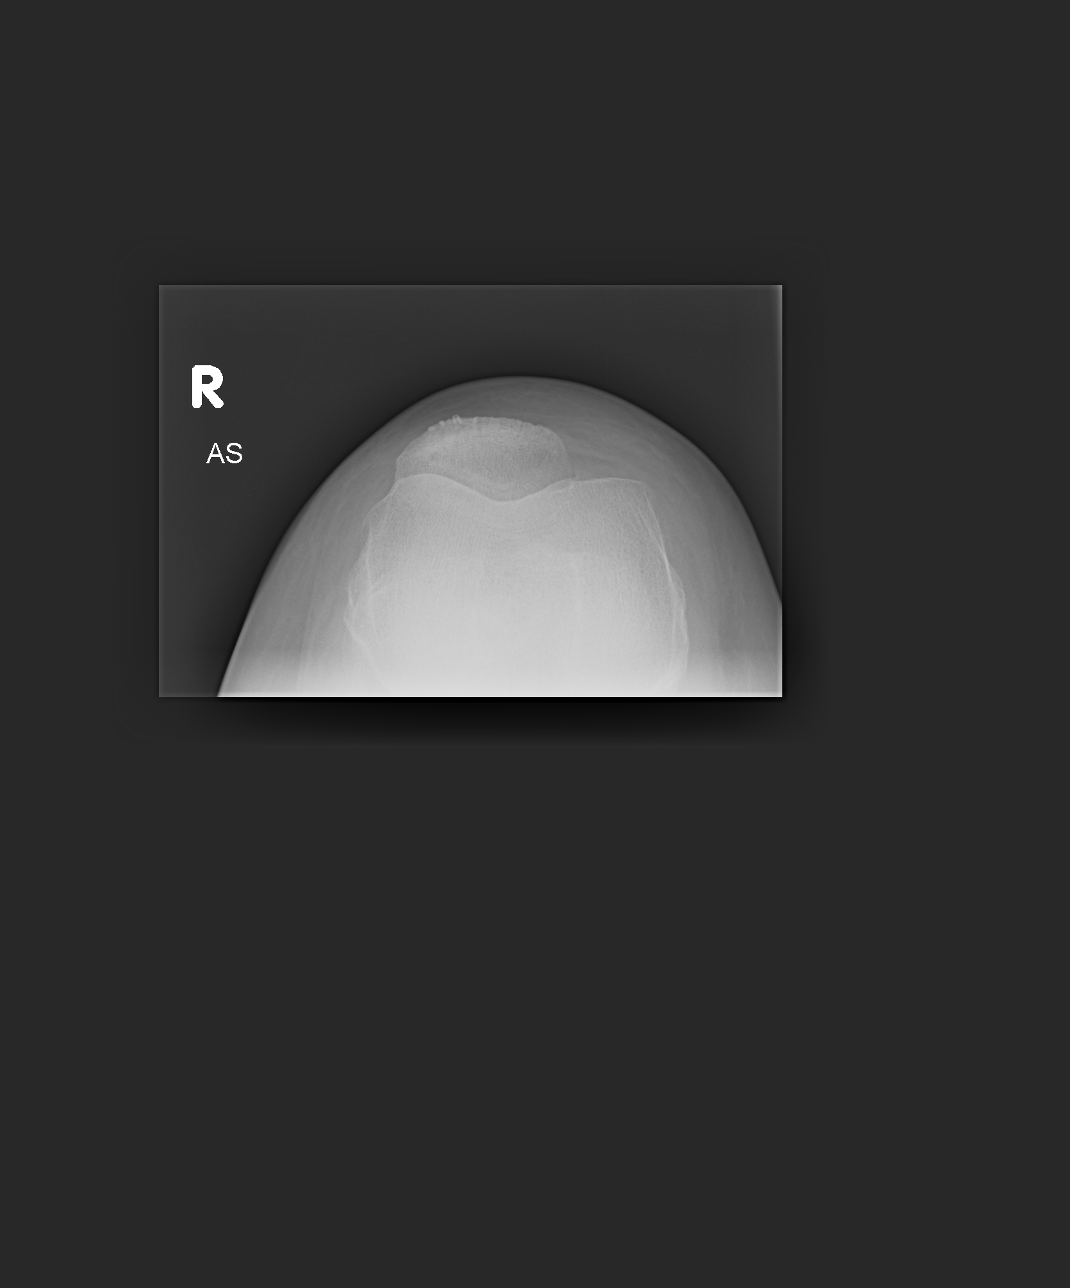

[4 of 4 positions shown; findings below may reference images not displayed]

FINDINGS: Four views of the right knee submitted. Mild narrowing of medial
joint compartment. Minimal spurring of femoral condyles. Small joint
effusion. No acute fracture or subluxation.
IMPRESSION: No acute fracture or subluxation. Mild osteoarthritic changes. Small
joint effusion.

## 2017-11-02 ENCOUNTER — Emergency Department (HOSPITAL_COMMUNITY)
Admission: EM | Admit: 2017-11-02 | Discharge: 2017-11-02 | Disposition: A | Discharge status: Home / Self Care | Payer: Medicare Other | Attending: Emergency Medicine | Admitting: Emergency Medicine

## 2017-11-02 ENCOUNTER — Encounter (HOSPITAL_COMMUNITY): Payer: Self-pay | Admitting: *Deleted

## 2017-11-02 ENCOUNTER — Other Ambulatory Visit: Payer: Self-pay

## 2017-11-02 DIAGNOSIS — M25562 Pain in left knee: Secondary | ICD-10-CM | POA: Diagnosis not present

## 2017-11-02 DIAGNOSIS — M79609 Pain in unspecified limb: Secondary | ICD-10-CM

## 2017-11-02 DIAGNOSIS — G8929 Other chronic pain: Secondary | ICD-10-CM | POA: Diagnosis not present

## 2017-11-02 DIAGNOSIS — R030 Elevated blood-pressure reading, without diagnosis of hypertension: Secondary | ICD-10-CM | POA: Insufficient documentation

## 2017-11-02 DIAGNOSIS — I10 Essential (primary) hypertension: Secondary | ICD-10-CM

## 2017-11-02 MED ORDER — KETOROLAC TROMETHAMINE 15 MG/ML IJ SOLN
15.0000 mg | Freq: Once | INTRAMUSCULAR | Status: AC
  Administered 2017-11-02: 15 mg via INTRAMUSCULAR
  Filled 2017-11-02: qty 1

## 2017-11-02 MED ORDER — ACETAMINOPHEN 325 MG PO TABS: 650.0000 mg | ORAL_TABLET | Freq: Four times a day (QID) | ORAL | 0 refills | Status: AC | PRN

## 2017-11-02 NOTE — Discharge Instructions (Addendum)
IMPORTANT PATIENT INSTRUCTIONS:  You have been scheduled for an Outpatient Vascular Study at [facility].    If tomorrow is a Saturday, Sunday or holiday, please go to the [facility] Emergency Department Registration Desk at 8 am tomorrow morning and tell them you are there for a vascular study.  If tomorrow is a weekday (Monday-Friday), please go to [facility]mitting Department at 8 am and tell them  you are there for a vascular study.  You are receiving this study to look for a cyst on the back of the knee or a blood clot.  It was my pleasure taking care of you today!   Please see the information and instructions below regarding your visit.  Your diagnoses today include:  1. Chronic pain of left knee   2. Popliteal pain   3. Elevated blood pressure reading with diagnosis of hypertension    Examination is reassuring today.  There appears to be a small amount of fluid on the knee, but no signs of infection.  Tests performed today include: See side panel of your discharge paperwork for testing performed today. Vital signs are listed at the bottom of these instructions.   Medications prescribed:    Take any prescribed medications only as prescribed, and any over the counter medications only as directed on the packaging.  Tylenol as needed for pain.  You may take Tylenol, 650 mg every 6 hours as needed for pain.  I would discontinue Celebrex, as it is not working.  Home care instructions:  Please follow any educational materials contained in this packet.   Follow-up instructions:  Call the orthopedist listed today or tomorrow to schedule follow up appointment for recheck of ongoing knee pain in one to two weeks. That appointment can be canceled with a 24-48 hour notice if complete resolution of pain.  Follow up with the orthopedist listed if symptoms do not improve in one week.   Return instructions:  Please return to the Emergency Department if you experience worsening  symptoms.  Please return for any increasing swelling, increasing pain, loss of color to the lower leg, or increasing redness. Please return if you have any other emergent concerns.  Additional Information:   Your vital signs today were: BP (!) 170/74 (BP Location: Right Arm)    Pulse 65    Temp 98.4 F (36.9 C) (Oral)    Resp 17    Ht 5\' 1"  (1.549 m)    Wt 74.4 kg (164 lb)    SpO2 96%    BMI 30.99 kg/m  If your blood pressure (BP) was elevated on multiple readings during this visit above 130 for the top number or above 80 for the bottom number, please have this repeated by your primary care provider within one month. --------------

## 2017-11-02 NOTE — ED Provider Notes (Signed)
[facility] EMERGENCY DEPARTMENT Provider Note   CSN: [id number] Arrival date & time: 11/02/17  1732     History   Chief Complaint Chief Complaint  Patient presents with  . Knee Pain    HPI Shannon Fernandez is a 67 y.o. female.  HPI  Patient is a 67 year old history hypertension presenting for left knee pain.  Patient reports that she has had left knee pain past month, and presented to urgent care 2 weeks ago for evaluation.  At that time, she was given a "shot" for pain, and a Celebrex prescription, but is unsure what the final diagnosis was.  Patient subsequently reports increased pain with ambulation, walking upstairs, and new popliteal pain.  Patient reports she has noticed some anterior knee pain and swelling where the majority of her pain but no swelling on the lower extremity, change in color, hot or swollen joints, decreased sensation distal to the left knee, or fever or chills.  Patient denies any recent injury.  Patient has not had any long immobilizations, hospitalizations, surgeries in the last month.  No history of DVT or PE.  History and examination performed with the assistance of family interpretation per the patient's request.  Past Medical History:  Diagnosis Date  . Abnormal EKG on admit 06/10/2012  . Back pain   . Chest pressure, on admit atypical 06/10/2012  . Elevated troponin    POC + Troponin l negative-FALSE POS. poc  . H/O craniotomy   . Headache   . Nausea   . Vomiting     Patient Active Problem List   Diagnosis Date Noted  . Abnormal EKG on admit, continues abnormal may be her normal EKG 06/10/2012  . Chest pressure, on admit atypical, GI in nature, Negative nuculear stress test 06/10/2012  . Epigastric pain after vomiting 06/09/2012  . Bruit, RFA 06/09/2012  . Leukocytosis on admission (? lab error) 06/09/2012  . Elevated troponin, POC +, Troponin I negative-FALSE POS. poc 06/08/2012    Past Surgical History:  Procedure  Laterality Date  . BACK SURGERY    . BACK SURGERY     preported rods with last back surgery  . CRANIOTOMY  5/12   in [place]     OB History   None      Home Medications    Prior to Admission medications   Medication Sig Start Date End Date Taking? Authorizing Provider  diazepam (VALIUM) 5 MG tablet Take 1 tablet (5 mg total) by mouth every 6 (six) hours as needed (muscle spasms). 01/04/13   Pisciotta, Elmyra Ricks, PA-C  diazepam (VALIUM) 5 MG tablet Take 1 tablet (5 mg total) by mouth every 12 (twelve) hours as needed for muscle spasms. 08/21/14   Hess, Hessie Diener, PA-C  HYDROcodone-acetaminophen (NORCO/VICODIN) 5-325 MG per tablet Take 1-2 tablets by mouth every 6 hours as needed for pain. 01/04/13   Pisciotta, Elmyra Ricks, PA-C  ibuprofen (ADVIL,MOTRIN) 800 MG tablet Take 1 tablet (800 mg total) by mouth 3 (three) times daily. 08/21/14   Hess, Hessie Diener, PA-C  oxyCODONE-acetaminophen (PERCOCET) 5-325 MG per tablet Take 1-2 tablets by mouth every 6 (six) hours as needed for severe pain. 08/21/14   Hess, Hessie Diener, PA-C    Family History No family history on file.  Social History Social History   Tobacco Use  . Smoking status: Never Smoker  . Smokeless tobacco: Never Used  Substance Use Topics  . Alcohol use: No  . Drug use: No     Allergies  Patient has no known allergies.   Review of Systems Review of Systems  Constitutional: Negative for chills and fever.  Musculoskeletal: Positive for arthralgias and joint swelling. Negative for myalgias.  Skin: Negative for color change.  Neurological: Negative for weakness and numbness.     Physical Exam Updated Vital Signs BP (!) 170/74 (BP Location: Right Arm)   Pulse 65   Temp 98.4 F (36.9 C) (Oral)   Resp 17   Ht 5\' 1"  (1.549 m)   Wt 74.4 kg (164 lb)   SpO2 96%   BMI 30.99 kg/m   Physical Exam  Constitutional: She appears well-developed and well-nourished. No distress.  Sitting comfortably in bed.  HENT:  Head: Normocephalic and  atraumatic.  Eyes: Conjunctivae are normal. Right eye exhibits no discharge. Left eye exhibits no discharge.  EOMs normal to gross examination.  Neck: Normal range of motion.  Cardiovascular: Normal rate and regular rhythm.  Intact, 2+ DP pulse of LLE.  Pulmonary/Chest:  Normal respiratory effort. Patient converses comfortably. No audible wheeze or stridor.  Abdominal: She exhibits no distension.  Musculoskeletal:  Left knee with tenderness to palpation of medial and lateral joint line, popliteal region. Full ROM.  No joint line tenderness. Small anterior joint effusion without erythema. No abnormal alignment or patellar mobility. No bruising, erythema or warmth overlaying the joint. No varus/valgus laxity.  Mild crepitus felt with McMurray's test.  No crepitus.  2+ DP pulses bilaterally. All compartments are soft. Sensation intact distal to injury.  Neurological: She is alert.  Cranial nerves intact to gross observation. Patient moves extremities without difficulty.  Skin: Skin is warm and dry. She is not diaphoretic.  Psychiatric: She has a normal mood and affect. Her behavior is normal. Judgment and thought content normal.  Nursing note and vitals reviewed.    ED Treatments / Results  Labs (all labs ordered are listed, but only abnormal results are displayed) Labs Reviewed - No data to display  EKG None  Radiology No results found.  Procedures Procedures (including critical care time)  Medications Ordered in ED Medications - No data to display   Initial Impression / Assessment and Plan / ED Course  I have reviewed the triage vital signs and the nursing notes.  Pertinent labs & imaging results that were available during my care of the patient were reviewed by me and considered in my medical decision making (see chart for details).  Clinical Course as of Nov 03 1954  Nancy Fetter Nov 02, 2017  1942 Discussed discontinuing Celebrex for favor of Tylenol for pain control. Single  dose ketorolac prescribed at lowest possible dose. Normal renal function in 2018 blood work.   [AM]    Clinical Course User Index [AM] Albesa Seen, PA-C   Patient is nontoxic-appearing and in no acute distress.  Examination is not consistent with septic arthritis of the left knee.  Patient has no trauma since x-ray performed 2 weeks ago to warrant repeat x-ray.  There is a small anterior joint effusion consistent with degenerative changes of the knee, but does not appear acute consistent with hemarthrosis additionally, patient has new popliteal pain since 2 weeks ago, felt to be low risk for DVT, however will schedule patient for outpatient ultrasound.  Feel that risk-benefit of anticoagulation at this time precludes anticoagulation until ultrasound is performed at 8 AM tomorrow.  Patient also provided with orthopedic follow-up to discuss further management of degenerative changes of the knee such as degenerative meniscus tear versus arthritis.  Patient  and family are in understanding and agrees with plan of care.    Blood pressure elevated today.  This was noted to patient.  Patient did not take antihypertensives today.  Patient has no signs or symptoms of hypertensive urgency/emergency.  This is a shared visit with Dr. Deno Etienne. Patient was independently evaluated by this attending physician. Attending physician consulted in evaluation and discharge management.  Final Clinical Impressions(s) / ED Diagnoses   Final diagnoses:  Chronic pain of left knee  Popliteal pain  Elevated blood pressure reading with diagnosis of hypertension    ED Discharge Orders        Ordered    LE VENOUS     11/02/17 1945    acetaminophen (TYLENOL) 325 MG tablet  Every 6 hours PRN     11/02/17 1952       Tamala Julian 11/02/17 2004    Floyd, Dan, DO 11/02/17 2022

## 2017-11-02 NOTE — ED Notes (Signed)
She had a xray at [facility] 2 weeks ago for the same

## 2017-11-02 NOTE — ED Triage Notes (Signed)
The pt is c/o lt knee pain for one month  No known injury  She has had the same previously in the rt knee

## 2017-11-03 ENCOUNTER — Ambulatory Visit (HOSPITAL_COMMUNITY)
Admission: RE | Admit: 2017-11-03 | Discharge: 2017-11-03 | Disposition: A | Discharge status: Home / Self Care | Payer: Medicare Other | Source: Ambulatory Visit | Attending: Physician Assistant | Admitting: Physician Assistant

## 2017-11-03 DIAGNOSIS — M25562 Pain in left knee: Secondary | ICD-10-CM | POA: Diagnosis present

## 2017-11-03 DIAGNOSIS — M79609 Pain in unspecified limb: Secondary | ICD-10-CM | POA: Diagnosis not present

## 2017-11-03 NOTE — Progress Notes (Signed)
LLE venous duplex prelim: negative for DVT and baker's cyst. Landry Mellow, RDMS, RVT

## 2017-11-04 MED FILL — MELOXICAM 7.5 MG TABLET: 7.5 | 30 days supply | Qty: 30 | Fill #0

## 2018-09-02 DIAGNOSIS — C50919 Malignant neoplasm of unspecified site of unspecified female breast: Secondary | ICD-10-CM

## 2018-09-02 HISTORY — DX: Malignant neoplasm of unspecified site of unspecified female breast: C50.919

## 2018-10-14 ENCOUNTER — Other Ambulatory Visit: Payer: Self-pay | Admitting: Oncology

## 2018-10-14 DIAGNOSIS — R921 Mammographic calcification found on diagnostic imaging of breast: Secondary | ICD-10-CM

## 2018-10-14 DIAGNOSIS — D0512 Intraductal carcinoma in situ of left breast: Secondary | ICD-10-CM | POA: Insufficient documentation

## 2018-10-14 DIAGNOSIS — N6320 Unspecified lump in the left breast, unspecified quadrant: Secondary | ICD-10-CM

## 2018-10-14 NOTE — Progress Notes (Signed)
I have entered order for left breast biopsy to be done 10/21/2018 and to see me 11/09/2018

## 2018-10-16 ENCOUNTER — Telehealth: Payer: Self-pay | Admitting: Oncology

## 2018-10-16 NOTE — Telephone Encounter (Signed)
Called patient via [place] [phone]. Not able to reach patient and left message re 6/8 appointments and information to follow up with [facility] for test.   Called secondary number and spoke with patient str [name] (speaks [place]). Spoke with [name] re appointments and following up with [facility] to arrange test. Per [name] she would like to receive a call back from [facility] regarding appointments to make sure everyone is on the same page and that patient has already had a bx and information was forwarded to our office (new patient)  Message routed to [facility] and added new patient scheduler also.

## 2018-10-22 NOTE — Progress Notes (Signed)
Shannon Fernandez  [phone] [phone]     ID: Shannon Fernandez DOB: [birth year]  MR#: [id number]  DSK#:[id number]  Patient Care Team: Shannon Fernandez., MD as PCP - General (Internal Medicine) Shannon Fernandez, Shannon Dad, MD as Consulting Physician (Oncology) Shannon Pace, MD as Referring Physician (Surgical Oncology) Shannon Ripple, DO as Consulting Physician (Obstetrics and Gynecology) Shannon Cruel, MD OTHER MD:   CHIEF COMPLAINT: estrogen receptor positive DCIS  CURRENT TREATMENT: Anastrozole while awaiting definitive surgery   HISTORY OF CURRENT ILLNESS: Shannon Fernandez had routine screening mammography on 02/03/2018 showing a possible abnormality in the left breast. She underwent bilateral diagnostic mammography with tomography and left breast ultrasonography at [facility] on 03/25/2018 showing: breast density category C; a suspicious mass measuring 2.8 cm in the retroareolar left breast at 1 o'clock; indeterminate calcifications measuring 0.9 cm in the upper inner left breast; no evidence of left axillary lymphadenopathy; 3 probably benign masses in the superior left breast between 12-1 o'clock.  [place] and therefore her biopsy was delayed. Repeat diagnostic mammogram and left ultrasound on 09/02/2018 were stable.   Accordingly on 09/16/2018 she proceeded to biopsy of the left breast areas in question. The pathology from this procedure ([id number]) showed:  1. 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  2. Left Breast, Upper-Inner Quadrant -Foci of ductal carcinoma in situ, intermediate grade, with necrosis and microcalcifications -Associated columnar cell alterations with usual ductal hyperplasia and microcalcifications.  -Prognostic indicators significant for: estrogen receptor, >95% positive and progesterone receptor, 60% positive, both with  strong staining intensity.  She met with Dr. Adair Fernandez in surgery at [facility] on 09/23/2018. Due to the cancer being multicentricl, left mastectomy was recommended.  The patient's subsequent history is as detailed below.   INTERVAL HISTORY: Shannon Fernandez was evaluated in the breast cancer clinic on 10/27/2018.  Her daughter Shannon Fernandez participated by speaker phone  REVIEW OF SYSTEMS: Shannon Fernandez states she has had a discharge from her left breast for many years.  She says this was evaluated when she lived in [place] and she was told there that "everything was all right".  She has not had any discharge recently. The patient denies unusual headaches, visual changes, nausea, vomiting, stiff neck, dizziness, or gait imbalance. There has been no cough, phlegm production, or pleurisy, no chest pain or pressure, and no change in bowel or bladder habits. The patient denies fever, rash, bleeding, unexplained fatigue or unexplained weight loss. A detailed review of systems was otherwise entirely negative.   PAST MEDICAL HISTORY: Past Medical History:  Diagnosis Date  . Abnormal EKG on admit 06/10/2012  . Back pain   . Breast cancer ([place]) 09/2018   DCIS  . Chest pressure, on admit atypical 06/10/2012  . Elevated troponin    POC + Troponin l negative-FALSE POS. poc  . H/O craniotomy   . Headache   . Nausea   . Vomiting     PAST SURGICAL HISTORY: Past Surgical History:  Procedure Laterality Date  . BACK SURGERY    . BACK SURGERY     preported rods with last back surgery  . 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  FAMILY HISTORY: Family History  Problem Relation Age of Onset  . Stomach cancer Father   . Breast cancer Maternal Aunt   . Breast cancer  Cousin   . Breast cancer Cousin    Patient's father was in his 12s when he died from stomach cancer. Patient's mother committed suicide the patient was 68 years old the patient denies a family hx of  ovarian cancer. A maternal aunt, and a cousin and her (the cousin's) daughter have been diagnosed with breast cancer.  The patient has 3 full siblings and 4 1/2 siblings.  There are no other cancers immediately in the immediate family that she is aware  GYNECOLOGIC HISTORY:  No LMP recorded. Patient is postmenopausal. Menarche: 68 years old Age at first live birth: 68 years old Elliott P 4 LMP age 5 Contraceptive none HRT none  Hysterectomy? no BSO? no   SOCIAL HISTORY: (updated 10/27/2018)  Shannon Fernandez moved here from [place] in 2012 to live closer to family. She is currently on disability because of back issues.  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  Her husband Shannon Fernandez used to work in the Designer, multimedia but currently is a used Teacher, early years/pre.  At home is test the patient and her husband.  Daughter Shannon Fernandez lives in [place] and is a homemaker. Daughter Shannon Fernandez is a Librarian, academic here at [name] is a firefighter in [employer]. Daughter Shannon Fernandez also works at [employer] is a [employer].    ADVANCED DIRECTIVES: In the absence of any documents to the contrary the patient's husband is her healthcare power of attorney   HEALTH MAINTENANCE: Social History   Tobacco Use  . Smoking status: Never Smoker  . Smokeless tobacco: Never Used  Substance Use Topics  . Alcohol use: No  . Drug use: No     Colonoscopy: declined  PAP: 12/30/2016, abnormal  Bone density: 01/10/2017, -0.7 (normal)   No Known Allergies  Current Outpatient Medications  Medication Sig Dispense Refill  . acetaminophen (TYLENOL) 325 MG tablet Take 2 tablets (650 mg total) by mouth every 6 (six) hours as needed. 56 tablet 0  . diazepam (VALIUM) 5 MG tablet Take 1 tablet (5 mg total) by mouth every 6 (six) hours as needed (muscle spasms). 10 tablet 0  . diazepam (VALIUM) 5 MG tablet Take 1 tablet (5 mg total) by mouth every 12 (twelve) hours as needed for muscle spasms. 10 tablet 0   . HYDROcodone-acetaminophen (NORCO/VICODIN) 5-325 MG per tablet Take 1-2 tablets by mouth every 6 hours as needed for pain. 15 tablet 0  . ibuprofen (ADVIL,MOTRIN) 800 MG tablet Take 1 tablet (800 mg total) by mouth 3 (three) times daily. 21 tablet 0  . oxyCODONE-acetaminophen (PERCOCET) 5-325 MG per tablet Take 1-2 tablets by mouth every 6 (six) hours as needed for severe pain. 10 tablet 0   No current facility-administered medications for this visit.     OBJECTIVE: Middle-aged Latin woman who appears well  Vitals:   10/27/18 1615  BP: (!) 147/67  Pulse: 74  Resp: 17  Temp: 98.7 F (37.1 C)  SpO2: 98%     Body mass index is 31.01 kg/m.   Wt Readings from Last 3 Encounters:  10/27/18 164 lb 1.6 oz (74.4 kg)  11/02/17 164 lb (74.4 kg)  08/21/14 180 lb (81.6 kg)      ECOG FS:1 - Symptomatic but completely ambulatory  Ocular: Sclerae unicteric, pupils round and equal Wearing a mask Lymphatic: No cervical or supraclavicular adenopathy Lungs no rales or rhonchi Heart regular rate and rhythm Abd soft, nontender, positive bowel sounds MSK no focal spinal tenderness, no  joint edema Neuro: non-focal, well-oriented, appropriate affect Breasts: No masses palpated in either breast.  No nipple discharge on the left.  Both axillae are benign.   LAB RESULTS:  CMP     Component Value Date/Time   NA 140 06/09/2012 0400   K 3.5 06/09/2012 0400   CL 105 06/09/2012 0400   CO2 27 06/09/2012 0400   GLUCOSE 89 06/09/2012 0400   BUN 12 06/09/2012 0400   CREATININE 0.57 06/09/2012 0400   CALCIUM 8.8 06/09/2012 0400   PROT 6.9 06/08/2012 1630   ALBUMIN 3.6 06/08/2012 1630   AST 23 06/08/2012 1630   ALT 26 06/08/2012 1630   ALKPHOS 61 06/08/2012 1630   BILITOT 0.4 06/08/2012 1630   GFRNONAA >90 06/09/2012 0400   GFRAA >90 06/09/2012 0400    No results found for: TOTALPROTELP, ALBUMINELP, A1GS, A2GS, BETS, BETA2SER, GAMS, MSPIKE, SPEI  No results found for: KPAFRELGTCHN,  LAMBDASER, KAPLAMBRATIO  Lab Results  Component Value Date   WBC 5.8 06/10/2012   NEUTROABS 15.7 (H) 06/08/2012   HGB 12.5 06/10/2012   HCT 38.2 06/10/2012   MCV 84.5 06/10/2012   PLT 196 06/10/2012    '@LASTCHEMISTRY' @  No results found for: LABCA2  No components found for: [id number]  No results for input(s): INR in the last 168 hours.  No results found for: LABCA2  No results found for: VOJ500  No results found for: XFG182  No results found for: XHB716  No results found for: CA2729  No components found for: HGQUANT  No results found for: CEA1 / No results found for: CEA1   No results found for: AFPTUMOR  No results found for: CHROMOGRNA  No results found for: PSA1  No visits with results within 3 Day(s) from this visit.  Latest known visit with results is:  Admission on 06/08/2012, Discharged on 06/10/2012  Component Date Value Ref Range Status  . WBC 06/08/2012 18.9* 4.0 - 10.5 K/uL Final  . RBC 06/08/2012 5.14* 3.87 - 5.11 MIL/uL Final  . Hemoglobin 06/08/2012 14.8  12.0 - 15.0 g/dL Final  . HCT 06/08/2012 43.3  36.0 - 46.0 % Final  . MCV 06/08/2012 84.2  78.0 - 100.0 fL Final  . MCH 06/08/2012 28.8  26.0 - 34.0 pg Final  . MCHC 06/08/2012 34.2  30.0 - 36.0 g/dL Final  . RDW 06/08/2012 13.4  11.5 - 15.5 % Final  . Platelets 06/08/2012 240  150 - 400 K/uL Final  . Neutrophils Relative % 06/08/2012 83* 43 - 77 % Final  . Neutro Abs 06/08/2012 15.7* 1.7 - 7.7 K/uL Final  . Lymphocytes Relative 06/08/2012 10* 12 - 46 % Final  . Lymphs Abs 06/08/2012 1.8  0.7 - 4.0 K/uL Final  . Monocytes Relative 06/08/2012 7  3 - 12 % Final  . Monocytes Absolute 06/08/2012 1.2* 0.1 - 1.0 K/uL Final  . Eosinophils Relative 06/08/2012 1  0 - 5 % Final  . Eosinophils Absolute 06/08/2012 0.2  0.0 - 0.7 K/uL Final  . Basophils Relative 06/08/2012 0  0 - 1 % Final  . Basophils Absolute 06/08/2012 0.0  0.0 - 0.1 K/uL Final  . Sodium 06/08/2012 141  135 - 145 mEq/L Final  .  Potassium 06/08/2012 3.3* 3.5 - 5.1 mEq/L Final  . Chloride 06/08/2012 104  96 - 112 mEq/L Final  . CO2 06/08/2012 23  19 - 32 mEq/L Final  . Glucose, Bld 06/08/2012 112* 70 - 99 mg/dL Final  . BUN 06/08/2012 16  6 -  23 mg/dL Final  . Creatinine, Ser 06/08/2012 0.60  0.50 - 1.10 mg/dL Final  . Calcium 06/08/2012 9.8  8.4 - 10.5 mg/dL Final  . GFR calc non Af Amer 06/08/2012 >90  >90 mL/min Final  . GFR calc Af Amer 06/08/2012 >90  >90 mL/min Final   Comment:                                 The eGFR has been calculated                          using the CKD EPI equation.                          This calculation has not been                          validated in all clinical                          situations.                          eGFR's persistently                          <90 mL/min signify                          possible Chronic Kidney Disease.  . Lipase 06/08/2012 44  11 - 59 U/L Final  . Troponin i, poc 06/08/2012 0.02  0.00 - 0.08 ng/mL Final  . Comment 3 06/08/2012          Final   Comment: Due to the release kinetics of cTnI,                          a negative result within the first hours                          of the onset of symptoms does not rule out                          myocardial infarction with certainty.                          If myocardial infarction is still suspected,                          repeat the test at appropriate intervals.  . Total Protein 06/08/2012 6.9  6.0 - 8.3 g/dL Final  . Albumin 06/08/2012 3.6  3.5 - 5.2 g/dL Final  . AST 06/08/2012 23  0 - 37 U/L Final  . ALT 06/08/2012 26  0 - 35 U/L Final  . Alkaline Phosphatase 06/08/2012 61  39 - 117 U/L Final  . Total Bilirubin 06/08/2012 0.4  0.3 - 1.2 mg/dL Final  . Bilirubin, Direct 06/08/2012 <0.1  0.0 - 0.3 mg/dL Final  . Indirect Bilirubin 06/08/2012 NOT CALCULATED  0.3 - 0.9 mg/dL Final  . Troponin i, poc 06/08/2012 0.11* 0.00 - 0.08  ng/mL Final  . Comment 06/08/2012 NOTIFIED  PHYSICIAN   Final  . Comment 3 06/08/2012          Final   Comment: Due to the release kinetics of cTnI,                          a negative result within the first hours                          of the onset of symptoms does not rule out                          myocardial infarction with certainty.                          If myocardial infarction is still suspected,                          repeat the test at appropriate intervals.  . Heparin Unfractionated 06/09/2012 0.30  0.30 - 0.70 IU/mL Final   Comment:                                 IF HEPARIN RESULTS ARE BELOW                          EXPECTED VALUES, AND PATIENT                          DOSAGE HAS BEEN CONFIRMED,                          SUGGEST FOLLOW UP TESTING                          OF ANTITHROMBIN III LEVELS.  . WBC 06/09/2012 6.0  4.0 - 10.5 K/uL Final  . RBC 06/09/2012 4.32  3.87 - 5.11 MIL/uL Final  . Hemoglobin 06/09/2012 12.2  12.0 - 15.0 g/dL Final   DELTA CHECK NOTED  . HCT 06/09/2012 36.4  36.0 - 46.0 % Final  . MCV 06/09/2012 84.3  78.0 - 100.0 fL Final  . MCH 06/09/2012 28.2  26.0 - 34.0 pg Final  . MCHC 06/09/2012 33.5  30.0 - 36.0 g/dL Final  . RDW 06/09/2012 13.5  11.5 - 15.5 % Final  . Platelets 06/09/2012 198  150 - 400 K/uL Final  . Troponin I 06/08/2012 <0.30  <0.30 ng/mL Final   Comment:                                 Due to the release kinetics of cTnI,                          a negative result within the first hours                          of the onset of symptoms does not rule out  myocardial infarction with certainty.                          If myocardial infarction is still suspected,                          repeat the test at appropriate intervals.  . Troponin I 06/09/2012 <0.30  <0.30 ng/mL Final   Comment:                                 Due to the release kinetics of cTnI,                          a negative result within the first hours                           of the onset of symptoms does not rule out                          myocardial infarction with certainty.                          If myocardial infarction is still suspected,                          repeat the test at appropriate intervals.  . Sodium 06/09/2012 140  135 - 145 mEq/L Final  . Potassium 06/09/2012 3.5  3.5 - 5.1 mEq/L Final  . Chloride 06/09/2012 105  96 - 112 mEq/L Final  . CO2 06/09/2012 27  19 - 32 mEq/L Final  . Glucose, Bld 06/09/2012 89  70 - 99 mg/dL Final  . BUN 06/09/2012 12  6 - 23 mg/dL Final  . Creatinine, Ser 06/09/2012 0.57  0.50 - 1.10 mg/dL Final  . Calcium 06/09/2012 8.8  8.4 - 10.5 mg/dL Final  . GFR calc non Af Amer 06/09/2012 >90  >90 mL/min Final  . GFR calc Af Amer 06/09/2012 >90  >90 mL/min Final   Comment:                                 The eGFR has been calculated                          using the CKD EPI equation.                          This calculation has not been                          validated in all clinical                          situations.                          eGFR's persistently                          <90 mL/min  signify                          possible Chronic Kidney Disease.  . Hgb A1c MFr Bld 06/08/2012 6.2* <5.7 % Final   Comment: (NOTE)                                                                                                                         According to the ADA Clinical Practice Recommendations for 2011, when                          HbA1c is used as a screening test:                           >=6.5%   Diagnostic of Diabetes Mellitus                                    (if abnormal result is confirmed)                          5.7-6.4%   Increased risk of developing Diabetes Mellitus                          References:Diagnosis and Classification of Diabetes Mellitus,Diabetes                          HQIO,9629,52(WUXLK 1):S62-S69 and Standards of Medical Care in                                   Diabetes - 2011,Diabetes GMWN,0272,53 (Suppl 1):S11-S61.  . Mean Plasma Glucose 06/08/2012 131* <117 mg/dL Final  . TSH 06/08/2012 3.975  0.350 - 4.500 uIU/mL Final  . MRSA by PCR 06/09/2012 NEGATIVE  NEGATIVE Final   Comment:                                 The GeneXpert MRSA Assay (FDA                          approved for NASAL specimens                          only), is one component of a                          comprehensive MRSA colonization  surveillance program. It is not                          intended to diagnose MRSA                          infection nor to guide or                          monitor treatment for                          MRSA infections.  . Cholesterol 06/09/2012 181  0 - 200 mg/dL Final  . Triglycerides 06/09/2012 176* <150 mg/dL Final  . HDL 06/09/2012 34* >39 mg/dL Final  . Total CHOL/HDL Ratio 06/09/2012 5.3  RATIO Final  . VLDL 06/09/2012 35  0 - 40 mg/dL Final  . LDL Cholesterol 06/09/2012 112* 0 - 99 mg/dL Final   Comment:                                 Total Cholesterol/HDL:CHD Risk                          Coronary Heart Disease Risk Table                                              Men   Women                           1/2 Average Risk   3.4   3.3                           Average Risk       5.0   4.4                           2 X Average Risk   9.6   7.1                           3 X Average Risk  23.4   11.0                                                          Use the calculated Patient Ratio                          above and the CHD Risk Table                          to determine the patient's CHD Risk.  ATP III CLASSIFICATION (LDL):                           <100     mg/dL   Optimal                           100-129  mg/dL   Near or Above                                             Optimal                           130-159  mg/dL   Borderline                            160-189  mg/dL   High                           >190     mg/dL   Very High  . Heparin Unfractionated 06/09/2012 0.35  0.30 - 0.70 IU/mL Final   Comment:                                 IF HEPARIN RESULTS ARE BELOW                          EXPECTED VALUES, AND PATIENT                          DOSAGE HAS BEEN CONFIRMED,                          SUGGEST FOLLOW UP TESTING                          OF ANTITHROMBIN III LEVELS.  . Troponin I 06/09/2012 <0.30  <0.30 ng/mL Final   Comment:                                 Due to the release kinetics of cTnI,                          a negative result within the first hours                          of the onset of symptoms does not rule out                          myocardial infarction with certainty.                          If myocardial infarction is still suspected,                          repeat the test at appropriate intervals.  . Troponin I  06/09/2012 <0.30  <0.30 ng/mL Final   Comment:                                 Due to the release kinetics of cTnI,                          a negative result within the first hours                          of the onset of symptoms does not rule out                          myocardial infarction with certainty.                          If myocardial infarction is still suspected,                          repeat the test at appropriate intervals.  . WBC 06/10/2012 5.8  4.0 - 10.5 K/uL Final  . RBC 06/10/2012 4.52  3.87 - 5.11 MIL/uL Final  . Hemoglobin 06/10/2012 12.5  12.0 - 15.0 g/dL Final  . HCT 06/10/2012 38.2  36.0 - 46.0 % Final  . MCV 06/10/2012 84.5  78.0 - 100.0 fL Final  . MCH 06/10/2012 27.7  26.0 - 34.0 pg Final  . MCHC 06/10/2012 32.7  30.0 - 36.0 g/dL Final  . RDW 06/10/2012 13.6  11.5 - 15.5 % Final  . Platelets 06/10/2012 196  150 - 400 K/uL Final    (this displays the last labs from the last 3 days)  No results found for: TOTALPROTELP, ALBUMINELP, A1GS, A2GS,  BETS, BETA2SER, GAMS, MSPIKE, SPEI (this displays SPEP labs)  No results found for: KPAFRELGTCHN, LAMBDASER, KAPLAMBRATIO (kappa/lambda light chains)  No results found for: HGBA, HGBA2QUANT, HGBFQUANT, HGBSQUAN (Hemoglobinopathy evaluation)   No results found for: LDH  No results found for: IRON, TIBC, IRONPCTSAT (Iron and TIBC)  No results found for: FERRITIN  Urinalysis No results found for: COLORURINE, APPEARANCEUR, LABSPEC, PHURINE, GLUCOSEU, HGBUR, BILIRUBINUR, KETONESUR, PROTEINUR, UROBILINOGEN, NITRITE, LEUKOCYTESUR   STUDIES: No results found.  ELIGIBLE FOR AVAILABLE RESEARCH PROTOCOL: no  ASSESSMENT: 68 y.o.  [place] woman s/p left breast biopsy x2 on 09/16/2018 for multicentric ductal carcinoma in situ, estrogen and progesterone receptor positive  (1) neoadjuvant anastrozole pending definitive surgery pending  (2) genetics testing  PLAN: I spent approximately 60 minutes face to face with Shannon Fernandez with more than 50% of that time spent in counseling and coordination of care. Specifically we reviewed the biology of the patient's diagnosis and the specifics of her situation. Shannon Fernandez understands that in noninvasive ductal carcinoma, also called ductal carcinoma in situ ("DCIS") the breast cancer cells remain trapped in the ducts were they started. They cannot travel to a vital organ. For that reason these cancers in themselves are not life-threatening.  If the whole breast is removed then all the ducts are removed and since the cancer cells are trapped in the ducts, the cure rate with mastectomy for noninvasive breast cancer is approximately 99%. Nevertheless we generally recommend lumpectomy, because there is no survival advantage to mastectomy.  However in the glorious situation we have multicentric disease.  While technically it would be  possible for her surgeon to remove the cancer in both areas, the results would be a good formed breast which would be not acceptable  to her.  Accordingly she understands that she does need lumpectomy for her left breast and is planning on that surgery.  Since the cure rate for ductal carcinoma in situ with mastectomy approach to 70%, she would need no adjuvant treatment.  The more complicated question for the patient is what kind of reconstruction she would want.  She knows that she definitely does want reconstruction.  She definitely does not want a foreign body in her body such as an implant.  Her choice then is between a muscle flap and a free flap.  She is very attracted to the idea of a free flap, because it is cosmetically superior.  She does understand that this is a more complicated surgery and that it is not available in [place].  After much discussion she is very clear that what she would like to is to have a left mastectomy with Diep reconstruction.  I am going to refer her to Dr. Abelino Derrick at [facility] for further discussion and definitive management.  Shannon Fernandez understands there may be some delay in her getting her surgery because of the current pandemic.  I would feel safer if she took anastrozole preop.  In that case if the surgery is delayed even for some months, it would be quite safe for her.  She is not attracted to the idea of antiestrogens but is willing to give it a try and I have written the prescription for her.  She has a good understanding of the possible toxicities side effects and complications of anastrozole.  If she has any unusual problems she will contact me.  Otherwise I will see her again in approximately 4 months.  I would think her surgery would be done by then.  She can then decide if she would like to continue anastrozole for prophylaxis or more likely discontinue it at that time  Finally Shannon Fernandez does qualify for genetics testing.  Hopefully we can schedule that for her within the next few weeks.Shannon Fernandez has a good understanding of the overall plan. She agrees with it. She knows the goal of  treatment in her case is cure. She will call with any problems that may develop before her next visit here.   Shannon Cruel, MD   10/27/2018 5:09 PM Medical Oncology and Hematology [facility] [address] [place], [place] [address] Tel. [phone]    Fax. [phone]   This document serves as a record of services personally performed by Lurline Del, MD. It was created on his behalf by Wilburn Mylar, a trained medical scribe. The creation of this record is based on the scribe's personal observations and the provider's statements to them.   I, Lurline Del MD, have reviewed the above documentation for accuracy and completeness, and I agree with the above.

## 2018-10-27 ENCOUNTER — Inpatient Hospital Stay: Payer: Medicare Other | Attending: Oncology | Admitting: Oncology

## 2018-10-27 ENCOUNTER — Other Ambulatory Visit: Payer: Self-pay | Admitting: *Deleted

## 2018-10-27 ENCOUNTER — Encounter: Payer: Self-pay | Admitting: Oncology

## 2018-10-27 ENCOUNTER — Other Ambulatory Visit: Payer: Self-pay

## 2018-10-27 VITALS — BP 147/67 | HR 74 | Temp 98.7°F | Resp 17 | Ht 61.0 in | Wt 164.1 lb

## 2018-10-27 DIAGNOSIS — D0512 Intraductal carcinoma in situ of left breast: Secondary | ICD-10-CM | POA: Diagnosis not present

## 2018-10-27 DIAGNOSIS — Z170 Estrogen receptor positive status [ER+]: Secondary | ICD-10-CM | POA: Diagnosis not present

## 2018-10-27 MED ORDER — ANASTROZOLE 1 MG PO TABS: 1.0000 mg | ORAL_TABLET | Freq: Every day | ORAL | 4 refills | Status: AC

## 2018-10-28 NOTE — Addendum Note (Signed)
Addended by: Chauncey Cruel on: 10/28/2018 09:19 AM   Modules accepted: Orders

## 2018-10-29 ENCOUNTER — Telehealth: Payer: Self-pay | Admitting: Oncology

## 2018-10-29 NOTE — Telephone Encounter (Signed)
Tried to reach regarding schedule °

## 2018-11-09 ENCOUNTER — Other Ambulatory Visit: Payer: Medicare Other

## 2018-11-09 ENCOUNTER — Ambulatory Visit: Payer: Medicare Other | Admitting: Oncology

## 2019-01-22 ENCOUNTER — Telehealth: Payer: Self-pay | Admitting: *Deleted

## 2019-01-22 NOTE — Telephone Encounter (Signed)
This RN followed up on referral to Dr Abelino Derrick for reconstruction- noted referral sent with authorized visit x1.  Noted number per internet given as [id number] - is a direct  VM for Dr Abelino Derrick. Message was left last week at this number with no return call received.  This RN obtained office number per website of [phone]. Per call- answered per automated recording stating unable to take calls at this time and to leave a message . Detailed message left per need to follow up on referral with name and date of birth of patient.  This RN then returned call to pt's dtr [name] and obtained VM- detailed message left informing her of inability to speak with someone in Dr [facility] office but request for return given to this office.  This RN also gave number to Dr [facility] on VM for [place] to call as well.

## 2019-02-15 ENCOUNTER — Telehealth: Payer: Self-pay | Admitting: *Deleted

## 2019-02-15 ENCOUNTER — Other Ambulatory Visit: Payer: Self-pay | Admitting: *Deleted

## 2019-02-15 DIAGNOSIS — R921 Mammographic calcification found on diagnostic imaging of breast: Secondary | ICD-10-CM

## 2019-02-15 NOTE — Telephone Encounter (Signed)
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  This RN discussed with Shannon Fernandez multiple attempts to contact Dr [facility] office without success. Discussed need proceed for surgical reconstruction consult with another plastic surgeon with recommendation for Dr Iran Planas.  Note pt was diagnosed in May 2020 -placed on anastrozole neo adjuvantly per pt and daughter's request for possible reconstruction surgeries.  This RN reiterated need to see a plastic surgeon ASAP so needed surgery can be scheduled.  Daughter is in agreement with above plan.  Referral made to Dr Iran Planas and records faxed to [phone].

## 2019-02-28 NOTE — Progress Notes (Signed)
No show

## 2019-03-01 ENCOUNTER — Encounter: Payer: Self-pay | Admitting: Oncology

## 2019-03-01 ENCOUNTER — Inpatient Hospital Stay: Payer: Medicare Other | Attending: Oncology | Admitting: Oncology

## 2019-03-01 DIAGNOSIS — D0512 Intraductal carcinoma in situ of left breast: Secondary | ICD-10-CM

## 2020-06-27 DIAGNOSIS — H11153 Pinguecula, bilateral: Secondary | ICD-10-CM | POA: Diagnosis not present

## 2020-06-29 DIAGNOSIS — M5441 Lumbago with sciatica, right side: Secondary | ICD-10-CM | POA: Diagnosis not present

## 2020-06-29 DIAGNOSIS — Z124 Encounter for screening for malignant neoplasm of cervix: Secondary | ICD-10-CM | POA: Diagnosis not present

## 2020-06-29 DIAGNOSIS — N888 Other specified noninflammatory disorders of cervix uteri: Secondary | ICD-10-CM | POA: Diagnosis not present

## 2020-06-29 DIAGNOSIS — N8110 Cystocele, unspecified: Secondary | ICD-10-CM | POA: Diagnosis not present

## 2020-06-29 DIAGNOSIS — M5442 Lumbago with sciatica, left side: Secondary | ICD-10-CM | POA: Diagnosis not present

## 2020-06-29 DIAGNOSIS — D0512 Intraductal carcinoma in situ of left breast: Secondary | ICD-10-CM | POA: Diagnosis not present

## 2020-06-29 DIAGNOSIS — G8929 Other chronic pain: Secondary | ICD-10-CM | POA: Diagnosis not present

## 2020-06-29 DIAGNOSIS — I10 Essential (primary) hypertension: Secondary | ICD-10-CM | POA: Diagnosis not present

## 2020-06-29 DIAGNOSIS — N898 Other specified noninflammatory disorders of vagina: Secondary | ICD-10-CM | POA: Diagnosis not present

## 2020-06-29 DIAGNOSIS — R102 Pelvic and perineal pain: Secondary | ICD-10-CM | POA: Diagnosis not present

## 2020-06-29 DIAGNOSIS — N939 Abnormal uterine and vaginal bleeding, unspecified: Secondary | ICD-10-CM | POA: Diagnosis not present

## 2020-06-29 DIAGNOSIS — R87610 Atypical squamous cells of undetermined significance on cytologic smear of cervix (ASC-US): Secondary | ICD-10-CM | POA: Diagnosis not present

## 2020-07-05 DIAGNOSIS — N950 Postmenopausal bleeding: Secondary | ICD-10-CM | POA: Diagnosis not present

## 2020-07-05 DIAGNOSIS — Z01419 Encounter for gynecological examination (general) (routine) without abnormal findings: Secondary | ICD-10-CM | POA: Diagnosis not present

## 2020-07-05 DIAGNOSIS — N812 Incomplete uterovaginal prolapse: Secondary | ICD-10-CM | POA: Diagnosis not present

## 2020-07-06 DIAGNOSIS — M4316 Spondylolisthesis, lumbar region: Secondary | ICD-10-CM | POA: Diagnosis not present

## 2020-07-06 DIAGNOSIS — M5136 Other intervertebral disc degeneration, lumbar region: Secondary | ICD-10-CM | POA: Diagnosis not present

## 2020-07-06 DIAGNOSIS — M48061 Spinal stenosis, lumbar region without neurogenic claudication: Secondary | ICD-10-CM | POA: Diagnosis not present

## 2020-07-06 DIAGNOSIS — Z981 Arthrodesis status: Secondary | ICD-10-CM | POA: Diagnosis not present

## 2020-07-06 DIAGNOSIS — M5441 Lumbago with sciatica, right side: Secondary | ICD-10-CM | POA: Diagnosis not present

## 2020-07-06 DIAGNOSIS — M5442 Lumbago with sciatica, left side: Secondary | ICD-10-CM | POA: Diagnosis not present

## 2020-07-31 DIAGNOSIS — Z7409 Other reduced mobility: Secondary | ICD-10-CM | POA: Diagnosis not present

## 2020-07-31 DIAGNOSIS — M48061 Spinal stenosis, lumbar region without neurogenic claudication: Secondary | ICD-10-CM | POA: Diagnosis not present

## 2020-07-31 DIAGNOSIS — M6281 Muscle weakness (generalized): Secondary | ICD-10-CM | POA: Diagnosis not present

## 2020-07-31 DIAGNOSIS — R29898 Other symptoms and signs involving the musculoskeletal system: Secondary | ICD-10-CM | POA: Diagnosis not present

## 2020-08-01 DIAGNOSIS — Z01810 Encounter for preprocedural cardiovascular examination: Secondary | ICD-10-CM | POA: Diagnosis not present

## 2020-08-01 DIAGNOSIS — R9431 Abnormal electrocardiogram [ECG] [EKG]: Secondary | ICD-10-CM | POA: Diagnosis not present

## 2020-08-01 DIAGNOSIS — N950 Postmenopausal bleeding: Secondary | ICD-10-CM | POA: Diagnosis not present

## 2020-08-04 DIAGNOSIS — R7301 Impaired fasting glucose: Secondary | ICD-10-CM | POA: Diagnosis not present

## 2020-08-04 DIAGNOSIS — I10 Essential (primary) hypertension: Secondary | ICD-10-CM | POA: Diagnosis not present

## 2020-08-04 DIAGNOSIS — E7800 Pure hypercholesterolemia, unspecified: Secondary | ICD-10-CM | POA: Diagnosis not present

## 2020-08-04 DIAGNOSIS — N950 Postmenopausal bleeding: Secondary | ICD-10-CM | POA: Diagnosis not present

## 2020-08-04 DIAGNOSIS — N858 Other specified noninflammatory disorders of uterus: Secondary | ICD-10-CM | POA: Diagnosis not present

## 2020-08-16 DIAGNOSIS — N812 Incomplete uterovaginal prolapse: Secondary | ICD-10-CM | POA: Diagnosis not present

## 2020-08-16 DIAGNOSIS — Z4689 Encounter for fitting and adjustment of other specified devices: Secondary | ICD-10-CM | POA: Diagnosis not present

## 2020-08-25 DIAGNOSIS — R29898 Other symptoms and signs involving the musculoskeletal system: Secondary | ICD-10-CM | POA: Diagnosis not present

## 2020-08-25 DIAGNOSIS — M6281 Muscle weakness (generalized): Secondary | ICD-10-CM | POA: Diagnosis not present

## 2020-08-25 DIAGNOSIS — M48061 Spinal stenosis, lumbar region without neurogenic claudication: Secondary | ICD-10-CM | POA: Diagnosis not present

## 2020-08-25 DIAGNOSIS — Z7409 Other reduced mobility: Secondary | ICD-10-CM | POA: Diagnosis not present

## 2020-09-06 DIAGNOSIS — Z7409 Other reduced mobility: Secondary | ICD-10-CM | POA: Diagnosis not present

## 2020-09-06 DIAGNOSIS — M48061 Spinal stenosis, lumbar region without neurogenic claudication: Secondary | ICD-10-CM | POA: Diagnosis not present

## 2020-09-06 DIAGNOSIS — M6281 Muscle weakness (generalized): Secondary | ICD-10-CM | POA: Diagnosis not present

## 2020-09-06 DIAGNOSIS — R29898 Other symptoms and signs involving the musculoskeletal system: Secondary | ICD-10-CM | POA: Diagnosis not present

## 2020-09-12 DIAGNOSIS — N819 Female genital prolapse, unspecified: Secondary | ICD-10-CM | POA: Diagnosis not present

## 2020-09-12 DIAGNOSIS — Z4689 Encounter for fitting and adjustment of other specified devices: Secondary | ICD-10-CM | POA: Diagnosis not present

## 2020-12-22 DIAGNOSIS — R7303 Prediabetes: Secondary | ICD-10-CM | POA: Diagnosis not present

## 2020-12-22 DIAGNOSIS — Z0000 Encounter for general adult medical examination without abnormal findings: Secondary | ICD-10-CM | POA: Diagnosis not present

## 2020-12-22 DIAGNOSIS — I10 Essential (primary) hypertension: Secondary | ICD-10-CM | POA: Diagnosis not present

## 2020-12-22 DIAGNOSIS — Z23 Encounter for immunization: Secondary | ICD-10-CM | POA: Diagnosis not present

## 2020-12-22 DIAGNOSIS — E785 Hyperlipidemia, unspecified: Secondary | ICD-10-CM | POA: Diagnosis not present

## 2020-12-22 DIAGNOSIS — M4800 Spinal stenosis, site unspecified: Secondary | ICD-10-CM | POA: Diagnosis not present

## 2020-12-22 DIAGNOSIS — Z1231 Encounter for screening mammogram for malignant neoplasm of breast: Secondary | ICD-10-CM | POA: Diagnosis not present

## 2020-12-22 DIAGNOSIS — K219 Gastro-esophageal reflux disease without esophagitis: Secondary | ICD-10-CM | POA: Diagnosis not present

## 2021-02-23 DIAGNOSIS — Z1231 Encounter for screening mammogram for malignant neoplasm of breast: Secondary | ICD-10-CM | POA: Diagnosis not present

## 2022-06-08 ENCOUNTER — Emergency Department (HOSPITAL_COMMUNITY): Payer: PPO

## 2022-06-08 ENCOUNTER — Other Ambulatory Visit: Payer: Self-pay

## 2022-06-08 ENCOUNTER — Encounter (HOSPITAL_COMMUNITY): Payer: Self-pay | Admitting: Pharmacy Technician

## 2022-06-08 ENCOUNTER — Emergency Department (HOSPITAL_COMMUNITY)
Admission: EM | Admit: 2022-06-08 | Discharge: 2022-06-08 | Disposition: A | Discharge status: Home / Self Care | Payer: PPO | Attending: Emergency Medicine | Admitting: Emergency Medicine

## 2022-06-08 DIAGNOSIS — M5442 Lumbago with sciatica, left side: Secondary | ICD-10-CM | POA: Insufficient documentation

## 2022-06-08 LAB — CBC WITH DIFFERENTIAL/PLATELET
Abs Immature Granulocytes: 0.02 K/uL (ref 0.00–0.07)
Basophils Absolute: 0 K/uL (ref 0.0–0.1)
Basophils Relative: 0 %
Eosinophils Absolute: 0.2 K/uL (ref 0.0–0.5)
Eosinophils Relative: 2 %
HCT: 45 % (ref 36.0–46.0)
Hemoglobin: 14.4 g/dL (ref 12.0–15.0)
Immature Granulocytes: 0 %
Lymphocytes Relative: 30 %
Lymphs Abs: 2.2 K/uL (ref 0.7–4.0)
MCH: 27.4 pg (ref 26.0–34.0)
MCHC: 32 g/dL (ref 30.0–36.0)
MCV: 85.6 fL (ref 80.0–100.0)
Monocytes Absolute: 0.5 K/uL (ref 0.1–1.0)
Monocytes Relative: 7 %
Neutro Abs: 4.3 K/uL (ref 1.7–7.7)
Neutrophils Relative %: 61 %
Platelets: 244 K/uL (ref 150–400)
RBC: 5.26 MIL/uL — ABNORMAL HIGH (ref 3.87–5.11)
RDW: 13.9 % (ref 11.5–15.5)
WBC: 7.1 K/uL (ref 4.0–10.5)
nRBC: 0 % (ref 0.0–0.2)

## 2022-06-08 LAB — URINALYSIS, ROUTINE W REFLEX MICROSCOPIC
Bilirubin Urine: NEGATIVE
Glucose, UA: NEGATIVE mg/dL
Hgb urine dipstick: NEGATIVE
Ketones, ur: NEGATIVE mg/dL
Leukocytes,Ua: NEGATIVE
Nitrite: NEGATIVE
Protein, ur: NEGATIVE mg/dL
Specific Gravity, Urine: 1.009 (ref 1.005–1.030)
pH: 8 (ref 5.0–8.0)

## 2022-06-08 LAB — BASIC METABOLIC PANEL
Anion gap: 8 (ref 5–15)
BUN: 7 mg/dL — ABNORMAL LOW (ref 8–23)
CO2: 29 mmol/L (ref 22–32)
Calcium: 9.4 mg/dL (ref 8.9–10.3)
Chloride: 102 mmol/L (ref 98–111)
Creatinine, Ser: 0.55 mg/dL (ref 0.44–1.00)
GFR, Estimated: >60 mL/min (ref >60)
Glucose, Bld: 102 mg/dL — ABNORMAL HIGH (ref 70–99)
Potassium: 4.1 mmol/L (ref 3.5–5.1)
Sodium: 139 mmol/L (ref 135–145)

## 2022-06-08 MED ORDER — ACETAMINOPHEN 500 MG PO TABS
1000.0000 mg | ORAL_TABLET | Freq: Once | ORAL | Status: AC
  Administered 2022-06-08: 1000 mg via ORAL
  Filled 2022-06-08: qty 2

## 2022-06-08 MED ORDER — DICLOFENAC EPOLAMINE 1.3 % EX PTCH
1.0000 | MEDICATED_PATCH | Freq: Two times a day (BID) | CUTANEOUS | Status: DC
  Administered 2022-06-08: 1 via TRANSDERMAL
  Filled 2022-06-08: qty 1

## 2022-06-08 MED ORDER — DEXAMETHASONE SODIUM PHOSPHATE 10 MG/ML IJ SOLN
10.0000 mg | Freq: Once | INTRAMUSCULAR | Status: AC
  Administered 2022-06-08: 10 mg via INTRAVENOUS
  Filled 2022-06-08: qty 1

## 2022-06-08 MED ORDER — FENTANYL CITRATE PF 50 MCG/ML IJ SOSY
25.0000 ug | PREFILLED_SYRINGE | Freq: Once | INTRAMUSCULAR | Status: AC
  Administered 2022-06-08: 25 ug via INTRAVENOUS
  Filled 2022-06-08: qty 1

## 2022-06-08 MED ORDER — PREDNISONE 20 MG PO TABS: 40.0000 mg | ORAL_TABLET | Freq: Every day | ORAL | 0 refills | Status: AC

## 2022-06-08 MED ORDER — HYDROCODONE-ACETAMINOPHEN 5-325 MG PO TABS: 1.0000 | ORAL_TABLET | Freq: Four times a day (QID) | ORAL | 0 refills | Status: AC | PRN

## 2022-06-08 NOTE — ED Triage Notes (Signed)
Pt bib family with worsening L leg pain. Pain started approx 6 months ago with worsening over the last month. Pt with no numbness or incontinence. Ambulatory to triage room.

## 2022-06-08 NOTE — Discharge Instructions (Addendum)
Please be sure to schedule a visit with our specialist, Dr. Rolena Infante at [facility].  In addition to the prescribed medications, please use patches, as pictured below for additional relief.  These should be applied to the area in your lower back where your spine meets your pelvis.  Return here for concerning changes to your condition.

## 2022-06-08 NOTE — ED Provider Triage Note (Signed)
Emergency Medicine Provider Triage Evaluation Note  Shannon Fernandez , a 72 y.o. female  was evaluated in triage.  Accompanied by daughter who provides translation.  Pt complains of worsening lower back pain.  Started 6 months ago.  Significantly worsened over the 2 to 3 weeks.  Pain radiates down the left leg, difficulty weightbearing and with walking.  Denies fever, IV drug use, recent injury, urinary/bowel incontinence, or saddle anesthesia.  Hx of lumbar surgery years ago.  Review of Systems  Positive:  Negative: See above  Physical Exam  There were no vitals taken for this visit. Gen:   Awake, no distress   Resp:  Normal effort  MSK:   Moves extremities without difficulty with exception of left lower leg.  Tenderness over the lumbar region, without midline tenderness Other:  No saddle anesthesia.  Sensation of lower extremities appears grossly intact.  Antalgic gait.  Medical Decision Making  Medically screening exam initiated at 2:37 PM.  Appropriate orders placed.  Shannon Fernandez was informed that the remainder of the evaluation will be completed by another provider, this initial triage assessment does not replace that evaluation, and the importance of remaining in the ED until their evaluation is complete.     Prince Rome, PA-C 37/29/02 1442

## 2022-06-08 NOTE — ED Provider Notes (Signed)
[facility] EMERGENCY DEPARTMENT Provider Note   CSN: [id number] Arrival date & time: 06/08/22  1328     History  Chief Complaint  Patient presents with   Leg Pain    Shannon Fernandez is a 72 y.o. female.  HPI Patient presents with her daughter who assists with the history.  She presents due to left low back pain radiating down the left leg.  She has had episodes similarly going back years, currently he is in an acute exacerbation.  She has multiple other medical problems, seemingly otherwise been in her usual state of health.  She takes OTC medication for pain control.  No new loss of sensation distally, no new fever, no new systemic complaints.  She went to urgent care yesterday, and with some concern of vaginal prolapse contributing to her symptoms she had physical exam, which was reportedly reassuring.  With ongoing symptoms she was encouraged to come to the emergency department for evaluation. History is notable for multiple back surgeries in the distant past, possibly 10 years ago while she was living in [place].  She apparently had a fall, necessitating surgeries at that time, including fixation.     Home Medications Prior to Admission medications   Medication Sig Start Date End Date Taking? Authorizing Provider  HYDROcodone-acetaminophen (NORCO/VICODIN) 5-325 MG tablet Take 1 tablet by mouth every 6 (six) hours as needed for severe pain. 06/08/22  Yes Carmin Muskrat, MD  predniSONE (DELTASONE) 20 MG tablet Take 2 tablets (40 mg total) by mouth daily with breakfast. For the next four days 06/08/22  Yes Carmin Muskrat, MD  acetaminophen (TYLENOL) 325 MG tablet Take 2 tablets (650 mg total) by mouth every 6 (six) hours as needed. 11/02/17   Langston Masker B, PA-C  anastrozole (ARIMIDEX) 1 MG tablet Take 1 tablet (1 mg total) by mouth daily. 10/27/18   Magrinat, Virgie Dad, MD  diazepam (VALIUM) 5 MG tablet Take 1 tablet (5 mg total) by mouth every 6 (six) hours as needed  (muscle spasms). 01/04/13   Pisciotta, Elmyra Ricks, PA-C  diazepam (VALIUM) 5 MG tablet Take 1 tablet (5 mg total) by mouth every 12 (twelve) hours as needed for muscle spasms. 08/21/14   Hess, Hessie Diener, PA-C  HYDROcodone-acetaminophen (NORCO/VICODIN) 5-325 MG per tablet Take 1-2 tablets by mouth every 6 hours as needed for pain. 01/04/13   Pisciotta, Elmyra Ricks, PA-C  ibuprofen (ADVIL,MOTRIN) 800 MG tablet Take 1 tablet (800 mg total) by mouth 3 (three) times daily. 08/21/14   Hess, Hessie Diener, PA-C  oxyCODONE-acetaminophen (PERCOCET) 5-325 MG per tablet Take 1-2 tablets by mouth every 6 (six) hours as needed for severe pain. 08/21/14   Hess, Hessie Diener, PA-C      Allergies    Patient has no known allergies.    Review of Systems   Review of Systems  All other systems reviewed and are negative.   Physical Exam Updated Vital Signs BP (!) 150/71   Pulse 63   Temp 98.2 F (36.8 C) (Oral)   Resp 16   SpO2 98%  Physical Exam Vitals and nursing note reviewed.  Constitutional:      General: She is not in acute distress.    Appearance: She is well-developed.  HENT:     Head: Normocephalic and atraumatic.  Eyes:     Conjunctiva/sclera: Conjunctivae normal.  Pulmonary:     Effort: Pulmonary effort is normal. No respiratory distress.     Breath sounds: No stridor.  Abdominal:  General: There is no distension.  Musculoskeletal:     Comments: No deformities, pelvis stable  Skin:    General: Skin is warm and dry.  Neurological:     Mental Status: She is alert and oriented to person, place, and time.     Cranial Nerves: No cranial nerve deficit.     Comments: Patient flexes and extends each hip, knee, spontaneously, to command, has referred pain with hip flexion left-sided, minimally right-sided referred to the left.  Distally sensation is intact.  Psychiatric:        Mood and Affect: Mood normal.     ED Results / Procedures / Treatments   Labs (all labs ordered are listed, but only abnormal results  are displayed) Labs Reviewed  URINALYSIS, ROUTINE W REFLEX MICROSCOPIC - Abnormal; Notable for the following components:      Result Value   APPearance HAZY (*)    All other components within normal limits  BASIC METABOLIC PANEL - Abnormal; Notable for the following components:   Glucose, Bld 102 (*)    BUN 7 (*)    All other components within normal limits  CBC WITH DIFFERENTIAL/PLATELET - Abnormal; Notable for the following components:   RBC 5.26 (*)    All other components within normal limits    EKG None  Radiology DG Lumbar Spine Complete  Result Date: 06/08/2022 CLINICAL DATA:  Low back pain. EXAM: LUMBAR SPINE - COMPLETE 4+ VIEW COMPARISON:  None Available. FINDINGS: Posterior lumbar and interbody fusion at L4-L5. Disc height loss at L3-L4 with marginal osteophytes. IMPRESSION: Posterior lumbar and interbody fusion at L4-L5. No evidence of hardware complication. Electronically Signed   By: Keane Police D.O.   On: 06/08/2022 15:41    Procedures Procedures    Medications Ordered in ED Medications  diclofenac (FLECTOR) 1.3 % 1 patch (1 patch Transdermal Patch Applied 06/08/22 1803)  dexamethasone (DECADRON) injection 10 mg (10 mg Intravenous Given 06/08/22 1802)  acetaminophen (TYLENOL) tablet 1,000 mg (1,000 mg Oral Given 06/08/22 1803)  fentaNYL (SUBLIMAZE) injection 25 mcg (25 mcg Intravenous Given 06/08/22 1800)    ED Course/ Medical Decision Making/ A&P This patient with a Hx of prior back surgery, episodic back pain presents to the ED for concern of acute worsening back pain, left leg pain, this involves an extensive number of treatment options, and is a complaint that carries with it a high risk of complications and morbidity.    The differential diagnosis includes neuropathy, infection, anemia   Social Determinants of Health:  Spanish-speaking primarily  Additional history obtained:  Additional history and/or information obtained from daughter at bedside, notable for  HPI.  Additional details from ED visit 5 years ago nearly similar symptoms, diagnosed with lumbar radiculopathy   After the initial evaluation, orders, including: Labs x-ray from triage started.  Analgesics with fentanyl steroids topical NSAID as well were initiated.   Patient placed on Cardiac and Pulse-Oximetry Monitors. The patient was maintained on a cardiac monitor.  The cardiac monitored showed an rhythm of 65 sinus normal The patient was also maintained on pulse oximetry. The readings were typically 100% room air normal   On repeat evaluation of the patient improved  Lab Tests:  I personally interpreted labs.  The pertinent results include: Unremarkable  Imaging Studies ordered:  I independently visualized and interpreted imaging which showed no evidence for loosening of hardware in lumbar spine on x-ray I agree with the radiologist interpretation Dispostion / Final MDM:  After consideration of the diagnostic  results and the patient's response to treatment, patient is awake, alert, feeling better.  I reviewed the x-rays in the room, demonstrated labs in the room to the patient and her daughter.  No evidence for new phenomenon, no red flags suggesting need for advanced imaging, and given history of somewhat prior similar episodes, long duration of pain, though this is an acute exacerbation, patient is appropriate for discharge with outpatient Ortho follow-up.  Final Clinical Impression(s) / ED Diagnoses Final diagnoses:  Acute left-sided low back pain with left-sided sciatica    Rx / DC Orders ED Discharge Orders          Ordered    predniSONE (DELTASONE) 20 MG tablet  Daily with breakfast        06/08/22 1926    HYDROcodone-acetaminophen (NORCO/VICODIN) 5-325 MG tablet  Every 6 hours PRN        06/08/22 1926              Carmin Muskrat, MD 06/08/22 1930

## 2022-06-08 NOTE — ED Notes (Signed)
C/o left leg pain x 6 months, worsening over last 4 weeks. Denies trauma. Distal CMS intact.

## 2022-07-10 ENCOUNTER — Other Ambulatory Visit: Payer: Self-pay | Admitting: Orthopedic Surgery

## 2022-07-10 DIAGNOSIS — M259 Joint disorder, unspecified: Secondary | ICD-10-CM

## 2022-07-16 ENCOUNTER — Other Ambulatory Visit: Payer: Self-pay | Admitting: Orthopedic Surgery

## 2022-07-16 DIAGNOSIS — M259 Joint disorder, unspecified: Secondary | ICD-10-CM

## 2022-08-22 ENCOUNTER — Ambulatory Visit
Admission: RE | Admit: 2022-08-22 | Discharge: 2022-08-22 | Disposition: A | Discharge status: Home / Self Care | Payer: PPO | Source: Ambulatory Visit | Attending: Orthopedic Surgery | Admitting: Orthopedic Surgery

## 2022-08-22 DIAGNOSIS — M259 Joint disorder, unspecified: Secondary | ICD-10-CM

## 2022-11-19 ENCOUNTER — Ambulatory Visit: Payer: PPO | Admitting: Physical Therapy

## 2022-12-03 ENCOUNTER — Telehealth: Payer: Self-pay | Admitting: Physical Therapy

## 2022-12-03 ENCOUNTER — Ambulatory Visit: Payer: PPO | Attending: Internal Medicine | Admitting: Physical Therapy

## 2022-12-03 NOTE — Telephone Encounter (Signed)
Called and LVM regarding missed appointment. Also informed patient she would need to be D/C from [facility] or only be seen at one location for her back pain if this was not already done already.  Maryruth Eve, PT, DPT

## 2023-10-16 ENCOUNTER — Emergency Department (HOSPITAL_COMMUNITY)

## 2023-10-16 ENCOUNTER — Other Ambulatory Visit: Payer: Self-pay

## 2023-10-16 ENCOUNTER — Emergency Department (HOSPITAL_COMMUNITY)
Admission: EM | Admit: 2023-10-16 | Discharge: 2023-10-16 | Disposition: A | Discharge status: Home / Self Care | Attending: Emergency Medicine | Admitting: Emergency Medicine

## 2023-10-16 DIAGNOSIS — M25551 Pain in right hip: Secondary | ICD-10-CM | POA: Diagnosis present

## 2023-10-16 MED ORDER — LIDOCAINE 5 % EX PTCH
1.0000 | MEDICATED_PATCH | CUTANEOUS | Status: DC
  Administered 2023-10-16: 1 via TRANSDERMAL
  Filled 2023-10-16: qty 1

## 2023-10-16 MED ORDER — LIDOCAINE 5 % EX PTCH: 1.0000 | MEDICATED_PATCH | CUTANEOUS | 0 refills | Status: AC

## 2023-10-16 MED ORDER — IBUPROFEN 400 MG PO TABS
600.0000 mg | ORAL_TABLET | Freq: Once | ORAL | Status: AC
  Administered 2023-10-16: 600 mg via ORAL
  Filled 2023-10-16: qty 1

## 2023-10-16 NOTE — ED Provider Notes (Signed)
 [place] EMERGENCY DEPARTMENT AT [facility] Provider Note   CSN: [id number] Arrival date & time: 10/16/23  0355     History  Chief Complaint  Patient presents with   Hip Pain   Leg Pain   HPI Shannon Fernandez is a 73 y.o. female with chronic back pain presenting for right hip pain.  She states that started about 3 to 4 months ago but worse in the last couple weeks.  States it hurts worse when she sitting down but improved by walking.  Denies any recent trauma to the hip or back.  Denies saddle anesthesia, urinary or bowel incontinence or retention.  Denies fever.  She states the pain is just progressively getting worse which prompted her to be evaluated here in the ED.  She has been taking tizanidine, Flexeril and Tylenol  which has helped minimally.   Hip Pain  Leg Pain      Home Medications Prior to Admission medications   Medication Sig Start Date End Date Taking? Authorizing Provider  lidocaine  (LIDODERM ) 5 % Place 1 patch onto the skin daily. Remove & Discard patch within 12 hours or as directed by MD 10/16/23  Yes Janalee Mcmurray, PA-C  acetaminophen  (TYLENOL ) 325 MG tablet Take 2 tablets (650 mg total) by mouth every 6 (six) hours as needed. 11/02/17   Murray, Alyssa B, PA-C  anastrozole  (ARIMIDEX ) 1 MG tablet Take 1 tablet (1 mg total) by mouth daily. 10/27/18   Magrinat, Rozella Cornfield, MD  diazepam  (VALIUM ) 5 MG tablet Take 1 tablet (5 mg total) by mouth every 6 (six) hours as needed (muscle spasms). 01/04/13   Pisciotta, Peterson Brandt, PA-C  diazepam  (VALIUM ) 5 MG tablet Take 1 tablet (5 mg total) by mouth every 12 (twelve) hours as needed for muscle spasms. 08/21/14   Hess, Real Camp, PA-C  HYDROcodone -acetaminophen  (NORCO/VICODIN) 5-325 MG per tablet Take 1-2 tablets by mouth every 6 hours as needed for pain. 01/04/13   Pisciotta, Peterson Brandt, PA-C  HYDROcodone -acetaminophen  (NORCO/VICODIN) 5-325 MG tablet Take 1 tablet by mouth every 6 (six) hours as needed for severe pain. 06/08/22    Dorenda Gandy, MD  ibuprofen  (ADVIL ,MOTRIN ) 800 MG tablet Take 1 tablet (800 mg total) by mouth 3 (three) times daily. 08/21/14   Hess, Real Camp, PA-C  oxyCODONE -acetaminophen  (PERCOCET) 5-325 MG per tablet Take 1-2 tablets by mouth every 6 (six) hours as needed for severe pain. 08/21/14   Hess, Real Camp, PA-C  predniSONE  (DELTASONE ) 20 MG tablet Take 2 tablets (40 mg total) by mouth daily with breakfast. For the next four days 06/08/22   Dorenda Gandy, MD      Allergies    Patient has no known allergies.    Review of Systems   See HPI  Physical Exam Updated Vital Signs BP (!) 155/54 (BP Location: Right Arm)   Pulse (!) 57   Temp 98.9 F (37.2 C) (Oral)   Resp 14   SpO2 100%  Physical Exam Constitutional:      Appearance: Normal appearance.  HENT:     Head: Normocephalic.     Nose: Nose normal.  Eyes:     Conjunctiva/sclera: Conjunctivae normal.  Pulmonary:     Effort: Pulmonary effort is normal.  Musculoskeletal:     Lumbar back: Normal. No tenderness. Normal range of motion.     Right hip: Tenderness present. No bony tenderness or crepitus. Normal range of motion. Normal strength.     Left hip: Normal. Normal strength.     Right  foot: Normal pulse.     Left foot: Normal pulse.  Neurological:     Mental Status: She is alert.  Psychiatric:        Mood and Affect: Mood normal.     ED Results / Procedures / Treatments   Labs (all labs ordered are listed, but only abnormal results are displayed) Labs Reviewed - No data to display  EKG None  Radiology DG Hip Unilat W or Wo Pelvis 2-3 Views Right Result Date: 10/16/2023 CLINICAL DATA:  Right hip pain EXAM: DG HIP (WITH OR WITHOUT PELVIS) 2-3V RIGHT COMPARISON:  None Available. FINDINGS: Normal alignment. No acute fracture or dislocation. Moderate to severe right hip degenerative arthritis with joint space narrowing osteophyte formation. Sacroiliac joint spaces left hip joint space appear preserved. Lumbar fusion and  posterior decompression is noted. IMPRESSION: 1. Moderate to severe right hip degenerative arthritis. Electronically Signed   By: Worthy Heads M.D.   On: 10/16/2023 04:36    Procedures Procedures    Medications Ordered in ED Medications  lidocaine  (LIDODERM ) 5 % 1 patch (has no administration in time range)  ibuprofen  (ADVIL ) tablet 600 mg (has no administration in time range)    ED Course/ Medical Decision Making/ A&P                                 Medical Decision Making Amount and/or Complexity of Data Reviewed Radiology: ordered.   73 year old well-appearing female presenting for right hip pain.  Exam was unremarkable.  The joint itself does not appear to be infected and she is actively ranging her right hip adequately.  Also doubt cauda equina syndrome given lack of symptoms.  Also doubt renal pathology given no flank tenderness and lack of symptoms.  She is also ambulatory without tenderness elicited.  Per chart review, patient is followed by [facility] for her hip pain.  Advised conservative treatment at home and NSAIDs.  Sent Lidoderm  patches to her pharmacy as well.  X-ray revealing moderate to severe right hip arthritis which I suspect is contributing to her symptoms.  Advised her to follow-up with [facility] and her PCP.  Discussed return precautions.        Final Clinical Impression(s) / ED Diagnoses Final diagnoses:  Right hip pain    Rx / DC Orders ED Discharge Orders          Ordered    lidocaine  (LIDODERM ) 5 %  Every 24 hours        10/16/23 0649              Janalee Mcmurray, PA-C 10/16/23 0650    Albertus Hughs, DO 10/16/23 [phone]

## 2023-10-16 NOTE — Discharge Instructions (Addendum)
 Of the patient today revealed that you have moderate to severe arthritis in your right hip.  This is likely contributing to your symptoms.  Recommend continued conservative treatment and ibuprofen .  I also sent Lidoderm  patches to your pharmacy as well.  Please follow-up with [facility] for your ongoing hip pain.  If you notice any swelling or redness, develop a fever, are unable to walk or any other concerning symptom please return to the ED for further evaluation.

## 2023-10-16 NOTE — ED Triage Notes (Signed)
 Patient reports chronic right hip pain radiating to right leg for several months , denies injury . Pain increases with movement /changing positions.

## 2023-10-16 NOTE — ED Notes (Signed)
Pt wheeled to waiting room. Pt verbalized understanding of discharge instructions.
